# Patient Record
Sex: Female | Born: 2000 | Race: Black or African American | Hispanic: No | Smoking: Never smoker
Health system: Southern US, Community
[De-identification: ages and names within clinical notes are randomized; demographics above are authoritative.]

## PROBLEM LIST (undated history)

## (undated) DIAGNOSIS — K219 Gastro-esophageal reflux disease without esophagitis: Secondary | ICD-10-CM

## (undated) DIAGNOSIS — R519 Headache, unspecified: Secondary | ICD-10-CM

## (undated) DIAGNOSIS — J45909 Unspecified asthma, uncomplicated: Secondary | ICD-10-CM

## (undated) DIAGNOSIS — L309 Dermatitis, unspecified: Secondary | ICD-10-CM

## (undated) HISTORY — DX: Dermatitis, unspecified: L30.9

## (undated) HISTORY — DX: Headache, unspecified: R51.9

## (undated) HISTORY — PX: WISDOM TOOTH EXTRACTION: SHX21

## (undated) HISTORY — PX: TYMPANOSTOMY TUBE PLACEMENT: SHX32

---
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
## 2021-01-01 ENCOUNTER — Ambulatory Visit (INDEPENDENT_AMBULATORY_CARE_PROVIDER_SITE_OTHER): Payer: Medicaid Other | Admitting: Pediatric Endocrinology

## 2021-01-01 ENCOUNTER — Ambulatory Visit: Payer: Medicaid Other | Admitting: Gastroenterology

## 2021-01-02 ENCOUNTER — Encounter: Payer: Self-pay | Admitting: Allergy & Immunology

## 2021-01-02 ENCOUNTER — Other Ambulatory Visit: Payer: Self-pay

## 2021-01-02 ENCOUNTER — Encounter (INDEPENDENT_AMBULATORY_CARE_PROVIDER_SITE_OTHER): Payer: Self-pay | Admitting: Dietician

## 2021-01-02 ENCOUNTER — Ambulatory Visit (INDEPENDENT_AMBULATORY_CARE_PROVIDER_SITE_OTHER): Payer: Medicaid Other | Admitting: Allergy & Immunology

## 2021-01-02 VITALS — BP 120/80 | HR 90 | Temp 97.3°F | Resp 16 | Ht 60.0 in | Wt 240.0 lb

## 2021-01-02 DIAGNOSIS — J310 Chronic rhinitis: Secondary | ICD-10-CM

## 2021-01-02 DIAGNOSIS — J4530 Mild persistent asthma, uncomplicated: Secondary | ICD-10-CM

## 2021-01-02 DIAGNOSIS — R0602 Shortness of breath: Secondary | ICD-10-CM

## 2021-01-02 MED ORDER — OMEPRAZOLE 40 MG PO CPDR: 40.0000 mg | DELAYED_RELEASE_CAPSULE | Freq: Every day | ORAL | 3 refills | Status: DC

## 2021-01-02 MED ORDER — ALBUTEROL SULFATE HFA 108 (90 BASE) MCG/ACT IN AERS: 2.0000 | INHALATION_SPRAY | Freq: Four times a day (QID) | RESPIRATORY_TRACT | 3 refills | Status: DC | PRN

## 2021-01-02 MED ORDER — CETIRIZINE HCL 10 MG PO TABS: 10.0000 mg | ORAL_TABLET | Freq: Every day | ORAL | 5 refills | Status: DC

## 2021-01-02 MED ORDER — MONTELUKAST SODIUM 10 MG PO TABS: 10.0000 mg | ORAL_TABLET | Freq: Every day | ORAL | 5 refills | Status: DC

## 2021-01-02 NOTE — Progress Notes (Signed)
FOLLOW UP  Date of Service/Encounter:  01/02/21   Assessment:   Non-allergic rhinitis  Mild persistent asthma, uncomplicated  Shortness of breath - likely not asthma related since spirometry has been normal and physical exams have been normal.  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 Mild persistent asthma, uncomplicated - Add on omeprazole 40mg  in the morning to see if this can help with reflux.  - Be sure to go to your Pulmonology appointment on June 11th @ 2:30 pm. - We are adding on a daly controller medication to see if this will help (AirDuo) one puff twice daily. - Daily controller medication(s): Singulair 10mg  daily + AirDuo one puff twice daily - Prior to physical activity: ProAir 2 puffs 10-15 minutes before physical activity. - Rescue medications: ProAir 4 puffs every 4-6 hours as needed - Changes during respiratory infections or worsening symptoms: Add on Qvar June 13 to 2 puffs twice daily for ONE TO TWO WEEKS. - Asthma control goals:  * Full participation in all desired activities (may need albuterol before activity) * Albuterol use two time or less a week on average (not counting use with activity) * Cough interfering with sleep two time or less a month * Oral steroids no more than once a year * No hospitalizations  2. Non-allergic rhinitis - Continue with: Zyrtec (cetirizine) 39mL once daily, Singulair (montelukast) 10mg  daily and Flonase (fluticasone) one spray per nostril daily - You can use an extra dose of the antihistamine, if needed, for breakthrough symptoms.  - Consider nasal saline rinses 1-2 times daily to remove allergens from the nasal cavities as well as help with mucous clearance (this is especially helpful to do before the nasal sprays are given)  3. Return in about 4 weeks (around 01/30/2021).   Subjective:   Lori [name] is a 20 y.o. female presenting  today for follow up of  Chief Complaint  Patient presents with  . Asthma    Chest pain due to a pulled muscle from a few months ago. Used rescue inhaler due to chest pain. Says the albuterol helped.   . Allergic Rhinitis     No that the pollen is out she is having some issues.    04/01/2021 has a history of the following: Patient Active Problem List   Diagnosis Date Noted  . Macromastia 09/06/2020  . Chronic midline thoracic back pain 09/06/2020  . Migraine with aura and without status migrainosus, not intractable 09/06/2020  . Dysmenorrhea in adolescent 09/06/2020  . Musculoskeletal chest pain 03/03/2020  . Non-allergic rhinitis 12/30/2018  . Mild persistent asthma, uncomplicated 01/08/2018  . Perennial allergic rhinitis 01/08/2018  . Elevated hemoglobin A1c 12/18/2015  . 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 Acanthosis 12/18/2015  . Vitamin D insufficiency 12/18/2015    History obtained from: chart review and patient.  Lori [name] is a 20 y.o. female presenting for a follow up visit.  She was last seen in May 2021.  At that time, we did not make any medication changes.  We did get a chest x-ray because he was endorsing shortness of breath.  We got a D-dimer as well which was essentially normal.  We continued Singulair 10mg  daily as well as ProAir as needed.   Since the last visit, she has continued to have issues. She has not seen Pulmonology yet. This is scheduled with Dr. Lucianne Lei on June 11th @ 2:30pm.   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 She is going to take a gap year to figure out things. She tells me today that she has a back up plan.   Asthma/Respiratory Symptom History: She reports that she is still having chest pains on a daily basis. This is midline and appears to be in the same place. She has been on reflux medications with improvement in the chest pain. This has not resolved it completely, however.   Allergic Rhinitis Symptom  History: She remains on the cetirizine as well as the montelukast and the fluticasone. She has not needed any antibiotics.   Food Allergy Symptom History: She has continued to avoid peanuts. she does fine with one or two peanuts, but she starts to have symptoms with more than a handful of peanuts. . She has no problems with eating a few peanuts. She has issues with throat swelling when she eats more than a few.   Otherwise, there have been no changes to her past medical history, surgical history, family history, or social history.    Review of Systems  Constitutional: Negative.  Negative for chills, fever, malaise/fatigue and weight loss.  HENT: Negative.  Negative for congestion, ear discharge, ear pain and sore throat.   Eyes: Negative for pain, discharge and redness.  Respiratory: Positive for shortness of breath. Negative for cough, sputum production and wheezing.   Cardiovascular: Negative.  Negative for chest pain and palpitations.  Gastrointestinal: Negative for abdominal pain, constipation, diarrhea, heartburn, nausea and vomiting.  Skin: Negative.  Negative for itching and rash.  Neurological: Negative for dizziness and headaches.  Endo/Heme/Allergies: Negative for environmental allergies. Does not bruise/bleed easily.       Objective:   Blood pressure 120/80, pulse 90, temperature (!) 97.3 F (36.3 C), resp. rate 16, height 5' (1.524 m), weight 240 lb (108.9 kg), SpO2 99 %. Body mass index is 46.87 kg/m.   Physical Exam:  Physical Exam Constitutional:      Appearance: She is well-developed.  HENT:     Head: Normocephalic and atraumatic.     Right Ear: Tympanic membrane, ear canal and external ear normal.     Left Ear: Tympanic membrane, ear canal and external ear normal.     Nose: No nasal deformity, septal deviation, mucosal edema or rhinorrhea.     Right Turbinates: Enlarged, swollen and pale.     Left Turbinates: Enlarged, swollen and pale.     Right Sinus: No  maxillary sinus tenderness or frontal sinus tenderness.     Left Sinus: No maxillary sinus tenderness or frontal sinus tenderness.     Mouth/Throat:     Mouth: Mucous membranes are not pale and not dry.     Pharynx: Uvula midline.  Eyes:     General:        Right eye: No discharge.        Left eye: No discharge.     Conjunctiva/sclera: Conjunctivae normal.     Right eye: Right conjunctiva is not injected. No chemosis.    Left eye: Left conjunctiva is not injected. No chemosis.    Pupils: Pupils are equal, round, and reactive to light.  Cardiovascular:     Rate and Rhythm: Normal rate and regular rhythm.     Heart sounds: Normal heart sounds.  Pulmonary:     Effort: Pulmonary effort is normal. No tachypnea, accessory muscle usage or respiratory distress.     Breath sounds: Normal breath sounds. No wheezing, rhonchi or  rales.     Comments: Moving air well in all lung fields. No increased work of breathing noted.  Chest:     Chest wall: No tenderness.  Lymphadenopathy:     Cervical: No cervical adenopathy.  Skin:    Coloration: Skin is not pale.     Findings: No abrasion, erythema, petechiae or rash. Rash is not papular, urticarial or vesicular.  Neurological:     Mental Status: She is alert.  Psychiatric:        Behavior: Behavior is cooperative.      Diagnostic studies:    Spirometry: results normal (FEV1: 2.26/87%, FVC: 2.78/98%, FEV1/FVC: 81%).    Spirometry consistent with normal pattern.  Allergy Studies: none       Malachi Bonds, MD  [facility]

## 2021-01-02 NOTE — Patient Instructions (Addendum)
1. Mild persistent asthma, uncomplicated - Add on omeprazole 40mg  in the morning to see if this can help with reflux.  - Be sure to go to your Pulmonology appointment on June 11th @ 2:30 pm. - We are adding on a daly controller medication to see if this will help (AirDuo) one puff twice daily. - Daily controller medication(s): Singulair 10mg  daily + AirDuo one puff twice daily - Prior to physical activity: ProAir 2 puffs 10-15 minutes before physical activity. - Rescue medications: ProAir 4 puffs every 4-6 hours as needed - Changes during respiratory infections or worsening symptoms: Add on Qvar June 13 to 2 puffs twice daily for ONE TO TWO WEEKS. - Asthma control goals:  * Full participation in all desired activities (may need albuterol before activity) * Albuterol use two time or less a week on average (not counting use with activity) * Cough interfering with sleep two time or less a month * Oral steroids no more than once a year * No hospitalizations  2. Non-allergic rhinitis - Continue with: Zyrtec (cetirizine) 58mL once daily, Singulair (montelukast) 10mg  daily and Flonase (fluticasone) one spray per nostril daily - You can use an extra dose of the antihistamine, if needed, for breakthrough symptoms.  - Consider nasal saline rinses 1-2 times daily to remove allergens from the nasal cavities as well as help with mucous clearance (this is especially helpful to do before the nasal sprays are given)  3. Return in about 4 weeks (around 01/30/2021).    Please inform [facility] of any Emergency Department visits, hospitalizations, or changes in symptoms. Call before going to the ED for breathing or allergy symptoms since we might be able to fit you in for a sick visit. Feel free to contact 04/01/2021 anytime with any questions, problems, or concerns.  It was a pleasure to see you and your family again today!  Websites that have reliable patient information: 1. American Academy of Asthma, Allergy, and  Immunology: www.aaaai.org 2. Food Allergy Research and Education (FARE): foodallergy.org 3. Mothers of Asthmatics: http://www.asthmacommunitynetwork.org 4. American College of Allergy, Asthma, and Immunology: www.acaai.org   COVID-19 Vaccine Information can be found at: [place] For questions related to vaccine distribution or appointments, please email [email or web address] or call [phone].     "Like" [place] on Facebook and Instagram for our latest updates!       Make sure you are registered to vote! If you have moved or changed any of your contact information, you will need to get this updated before voting!  In some cases, you MAY be able to register to vote online: [email or web address]

## 2021-01-09 ENCOUNTER — Encounter: Payer: Self-pay | Admitting: Gastroenterology

## 2021-01-09 ENCOUNTER — Ambulatory Visit (INDEPENDENT_AMBULATORY_CARE_PROVIDER_SITE_OTHER): Payer: Medicaid Other | Admitting: Gastroenterology

## 2021-01-09 ENCOUNTER — Other Ambulatory Visit: Payer: Self-pay

## 2021-01-09 VITALS — BP 111/77 | HR 103 | Ht 60.0 in | Wt 238.4 lb

## 2021-01-09 DIAGNOSIS — R1013 Epigastric pain: Secondary | ICD-10-CM

## 2021-01-09 NOTE — Progress Notes (Signed)
Wyline Mood MD, MRCP(U.K) [address]  [address]  [place], [place] [address]  Main: [phone]  Fax: [phone]   Gastroenterology Consultation  Referring Provider:     Lamonte Richer, DO Primary Care Physician:  Lamonte Richer, DO Primary Gastroenterologist:  Dr. Wyline Mood  Reason for Consultation:     Epigastric pain        HPI:   Lori [name] is a 20 y.o. y/o female referred for consultation & management  by Dr. Renette Butters, Deon Pilling, DO.      She has been referred for epigastric pain.  She presented to the emergency room at [facility] on 10/27/2020 with chest pain and shortness of breath.  She has a history of type 2 diabetes.  The pain is felt to be from the epigastrium.  Sent home to follow-up with GI.  She is here today with her mother who is an employee at our hospital.  She states that for the past few months she has had epigastric discomfort on and off described as a sharp discomfort sometimes radiating to her chest.  Lasting for a few hours.  No clear aggravating or relieving factors.  Sometimes worse when she lays flat.  Also worse when she bends forward.  No change in weight.  Denies any difficulty swallowing.  Was prescribed omeprazole but is yet to start taking it.  Does have a sensation of early satiety at times.  Has used marijuana in the past.  Possibly also from passive smoking as well.  Past Medical History:  Diagnosis Date  . Asthma   . Eczema   . Headache     Past Surgical History:  Procedure Laterality Date  . TYMPANOSTOMY TUBE PLACEMENT    . WISDOM TOOTH EXTRACTION      Prior to Admission medications   Medication Sig Start Date End Date Taking? Authorizing Provider  albuterol (VENTOLIN HFA) 108 (90 Base) MCG/ACT inhaler Inhale 2 puffs into the lungs every 6 (six) hours as needed for wheezing or shortness of breath. 01/02/21  Yes Alfonse Spruce, MD  butalbital-acetaminophen-caffeine [facility]) [phone] MG tablet Take 1 tablet by mouth  every 6 (six) hours as needed for headache. 10/18/20 10/18/21 Yes Joni Reining, PA-C  cetirizine (ZYRTEC) 10 MG tablet Take 1 tablet (10 mg total) by mouth daily. 01/02/21 02/01/21 Yes Alfonse Spruce, MD  clobetasol ointment (TEMOVATE) 0.05 % Apply 1 application topically 2 (two) times daily. 10/05/20  Yes Alfonse Spruce, MD  Drospirenone (SLYND) 4 MG TABS Take 1 tablet by mouth daily. 09/05/20  Yes Leftwich-Kirby, Wilmer Floor, CNM  levocetirizine (XYZAL) 5 MG tablet Take 1 tablet (5 mg total) by mouth every evening. 10/28/20  Yes Roxy Horseman, PA-C  montelukast (SINGULAIR) 10 MG tablet Take 1 tablet (10 mg total) by mouth at bedtime. 01/02/21  Yes Alfonse Spruce, MD  omeprazole (PRILOSEC) 40 MG capsule Take 1 capsule (40 mg total) by mouth daily. 01/02/21 02/01/21 Yes Alfonse Spruce, MD  rizatriptan (MAXALT-MLT) 10 MG disintegrating tablet Take 1 tablet (10 mg total) by mouth as needed for migraine (take 1 tab at severe headache onset, you may repeat second dose after 2 hours if needed but not more than 2 tablets a day). May repeat in 2 hours if needed 10/20/20  Yes Abdelmoumen, Imane, MD  topiramate (TOPAMAX) 25 MG tablet Take 2 tablets (50 mg total) by mouth at bedtime. Start with 25 mg tablet at bedtime for 3 days then continue on  50 mg daily at bedtime. 10/20/20  Yes Abdelmoumen, Jenna Luo, MD  acetaminophen (TYLENOL) 325 MG tablet Take 2 tablets (650 mg total) by mouth every 6 (six) hours as needed for mild pain, fever or headache. 10/04/16   Scoville, Nadara Mustard, NP  methylPREDNISolone (MEDROL DOSEPAK) 4 MG TBPK tablet Take by mouth. Patient not taking: Reported on 01/09/2021 10/25/20   [provider]  ondansetron (ZOFRAN ODT) 4 MG disintegrating tablet Take 1 tablet (4 mg total) by mouth every 8 (eight) hours as needed for nausea or vomiting. 10/28/20   Roxy Horseman, PA-C  sucralfate (CARAFATE) 1 g tablet Take 1 tablet (1 g total) by mouth 4 (four) times daily -  with meals  and at bedtime. Patient not taking: Reported on 01/09/2021 10/28/20   Roxy Horseman, PA-C  SUMAtriptan (IMITREX) 25 MG tablet Take by mouth. 07/27/20 10/20/20  [provider]    Family History  Problem Relation Age of Onset  . Hypertension Mother   . Hyperlipidemia Father   . Diabetes type II Father   . Depression Father   . Plantar fasciitis Father   . Graves' disease Maternal Grandmother   . Diabetes type II Maternal Grandmother   . Hypertension Maternal Grandmother   . Kidney Stones Maternal Grandmother   . Stroke Maternal Grandfather   . Hypertension Maternal Grandfather   . Hyperlipidemia Maternal Grandfather   . Heart attack Maternal Grandfather   . Diabetes type II Maternal Grandfather   . Diabetes Paternal Grandmother   . Diabetes type II Paternal Grandmother   . Hypertension Paternal Grandmother   . Kidney failure Paternal Grandfather   . Diabetic kidney disease Paternal Grandfather   . Hypertension Paternal Grandfather   . Diabetes type II Paternal Grandfather   . Asthma Sister   . Diabetes type I Maternal Great-grandmother   . Obesity Sister 48  . Vitamin D deficiency Sister 13  .  (Acanthosis) Sister 64  .  (Elevated hemoglobin A1c) Sister 82     Social History   Tobacco Use  . Smoking status: Passive Smoke Exposure - Never Smoker  . Smokeless tobacco: Never Used  . Tobacco comment: father smokes inside the home  Vaping Use  . Vaping Use: Never used  Substance Use Topics  . Alcohol use: No  . Drug use: Yes    Types: Marijuana    Comment: once on 19th B-Day    Allergies as of 01/09/2021 - Review Complete 01/09/2021  Allergen Reaction Noted  . Peanut-containing drug products  10/02/2020    Review of Systems:    All systems reviewed and negative except where noted in HPI.   Physical Exam:  BP 111/77 (BP Location: Right Arm, Patient Position: Sitting, Cuff Size: Large)   Pulse (!) 103   Ht 5' (1.524 m)   Wt 238 lb 6.4 oz (108.1 kg)   BMI  46.56 kg/m  No LMP recorded. Psych:  Alert and cooperative. Normal mood and affect. General:   Alert,  Well-developed, well-nourished, pleasant and cooperative in NAD Head:  Normocephalic and atraumatic. Eyes:  Sclera clear, no icterus.   Conjunctiva pink. Ears:  Normal auditory acuity. Lungs:  Respirations even and unlabored.  Clear throughout to auscultation.   No wheezes, crackles, or rhonchi. No acute distress. Heart:  Regular rate and rhythm; no murmurs, clicks, rubs, or gallops. Abdomen:  Normal bowel sounds.  No bruits.  Soft, non-tender and non-distended without masses, hepatosplenomegaly or hernias noted.  No guarding or rebound tenderness.  Neurologic:  Alert and oriented x3;  grossly normal neurologically. Psych:  Alert and cooperative. Normal mood and affect.  Imaging Studies: No results found.  Assessment and Plan:   Lori [name] is a 20 y.o. y/o female has been referred for epigastric discomfort.  Her symptoms very likely are suggestive of acid reflux especially the fact that it occurs when it is worse when she bends forward or lays flat.  He does have a history of heartburn, chest discomfort.  She has not undertaken a trial of PPI also prescribed.  We discussed about starting on a PPI for about 6 weeks to see if it helps.  I talked about lifestyle changes for acid reflux including using a wedge pillow, avoid eating for 2 hours  before bedtime, having small meals more often rather than large meals at one time.  I also discussed about avoiding exposure to marijuana, she does have relief after taking a hot shower which could be related to the marijuana use.  It can also delay gastric emptying and worsen acid reflux.  I also explained to her that weight loss would significantly help her with acid reflux.  If these conservative measures do not work in 6 to 8 weeks we will consider endoscopy at that point of time.  I will also check her for H. pylori   Follow up in 6 to 8 weeks  video visit  Dr Wyline Mood MD,MRCP(U.K)

## 2021-01-09 NOTE — Patient Instructions (Signed)
Gastroesophageal Reflux Disease, Adult  Gastroesophageal reflux (GER) happens when acid from the stomach flows up into the tube that connects the mouth and the stomach (esophagus). Normally, food travels down the esophagus and stays in the stomach to be digested. With GER, food and stomach acid sometimes move back up into the esophagus. You may have a disease called gastroesophageal reflux disease (GERD) if the reflux:  Happens often.  Causes frequent or very bad symptoms.  Causes problems such as damage to the esophagus. When this happens, the esophagus becomes sore and swollen. Over time, GERD can make small holes (ulcers) in the lining of the esophagus. What are the causes? This condition is caused by a problem with the muscle between the esophagus and the stomach. When this muscle is weak or not normal, it does not close properly to keep food and acid from coming back up from the stomach. The muscle can be weak because of:  Tobacco use.  Pregnancy.  Having a certain type of hernia (hiatal hernia).  Alcohol use.  Certain foods and drinks, such as coffee, chocolate, onions, and peppermint. What increases the risk?  Being overweight.  Having a disease that affects your connective tissue.  Taking NSAIDs, such a ibuprofen. What are the signs or symptoms?  Heartburn.  Difficult or painful swallowing.  The feeling of having a lump in the throat.  A bitter taste in the mouth.  Bad breath.  Having a lot of saliva.  Having an upset or bloated stomach.  Burping.  Chest pain. Different conditions can cause chest pain. Make sure you see your doctor if you have chest pain.  Shortness of breath or wheezing.  A long-term cough or a cough at night.  Wearing away of the surface of teeth (tooth enamel).  Weight loss. How is this treated?  Making changes to your diet.  Taking medicine.  Having surgery. Treatment will depend on how bad your symptoms are. Follow these  instructions at home: Eating and drinking  Follow a diet as told by your doctor. You may need to avoid foods and drinks such as: ? Coffee and tea, with or without caffeine. ? Drinks that contain alcohol. ? Energy drinks and sports drinks. ? Bubbly (carbonated) drinks or sodas. ? Chocolate and cocoa. ? Peppermint and mint flavorings. ? Garlic and onions. ? Horseradish. ? Spicy and acidic foods. These include peppers, chili powder, curry powder, vinegar, hot sauces, and BBQ sauce. ? Citrus fruit juices and citrus fruits, such as oranges, lemons, and limes. ? Tomato-based foods. These include red sauce, chili, salsa, and pizza with red sauce. ? Fried and fatty foods. These include donuts, french fries, potato chips, and high-fat dressings. ? High-fat meats. These include hot dogs, rib eye steak, sausage, ham, and bacon. ? High-fat dairy items, such as whole milk, butter, and cream cheese.  Eat small meals often. Avoid eating large meals.  Avoid drinking large amounts of liquid with your meals.  Avoid eating meals during the 2-3 hours before bedtime.  Avoid lying down right after you eat.  Do not exercise right after you eat.   Lifestyle  Do not smoke or use any products that contain nicotine or tobacco. If you need help quitting, ask your doctor.  Try to lower your stress. If you need help doing this, ask your doctor.  If you are overweight, lose an amount of weight that is healthy for you. Ask your doctor about a safe weight loss goal.   General instructions    Pay attention to any changes in your symptoms.  Take over-the-counter and prescription medicines only as told by your doctor.  Do not take aspirin, ibuprofen, or other NSAIDs unless your doctor says it is okay.  Wear loose clothes. Do not wear anything tight around your waist.  Raise (elevate) the head of your bed about 6 inches (15 cm). You may need to use a wedge to do this.  Avoid bending over if this makes your  symptoms worse.  Keep all follow-up visits. Contact a doctor if:  You have new symptoms.  You lose weight and you do not know why.  You have trouble swallowing or it hurts to swallow.  You have wheezing or a cough that keeps happening.  You have a hoarse voice.  Your symptoms do not get better with treatment. Get help right away if:  You have sudden pain in your arms, neck, jaw, teeth, or back.  You suddenly feel sweaty, dizzy, or light-headed.  You have chest pain or shortness of breath.  You vomit and the vomit is green, yellow, or black, or it looks like blood or coffee grounds.  You faint.  Your poop (stool) is red, bloody, or black.  You cannot swallow, drink, or eat. These symptoms may represent a serious problem that is an emergency. Do not wait to see if the symptoms will go away. Get medical help right away. Call your local emergency services (911 in the [place] Do not drive yourself to the hospital. Summary  If a person has gastroesophageal reflux disease (GERD), food and stomach acid move back up into the esophagus and cause symptoms or problems such as damage to the esophagus.  Treatment will depend on how bad your symptoms are.  Follow a diet as told by your doctor.  Take all medicines only as told by your doctor. This information is not intended to replace advice given to you by your health care provider. Make sure you discuss any questions you have with your health care provider. Document Revised: 03/20/2020 Document Reviewed: 03/20/2020 Elsevier Patient Education  2021 Elsevier Inc.  

## 2021-01-10 LAB — H. PYLORI BREATH TEST: H pylori Breath Test: NEGATIVE

## 2021-01-11 ENCOUNTER — Encounter: Payer: Self-pay | Admitting: Gastroenterology

## 2021-01-18 ENCOUNTER — Ambulatory Visit (INDEPENDENT_AMBULATORY_CARE_PROVIDER_SITE_OTHER): Payer: Medicaid Other | Admitting: Pediatrics

## 2021-01-30 ENCOUNTER — Ambulatory Visit: Payer: Medicaid Other | Admitting: Allergy & Immunology

## 2021-03-05 ENCOUNTER — Telehealth (INDEPENDENT_AMBULATORY_CARE_PROVIDER_SITE_OTHER): Payer: Medicaid Other | Admitting: Gastroenterology

## 2021-03-05 ENCOUNTER — Other Ambulatory Visit: Payer: Self-pay

## 2021-03-05 ENCOUNTER — Encounter: Payer: Self-pay | Admitting: Gastroenterology

## 2021-03-05 DIAGNOSIS — Z87898 Personal history of other specified conditions: Secondary | ICD-10-CM | POA: Diagnosis not present

## 2021-03-05 DIAGNOSIS — K219 Gastro-esophageal reflux disease without esophagitis: Secondary | ICD-10-CM | POA: Diagnosis not present

## 2021-03-05 DIAGNOSIS — F1291 Cannabis use, unspecified, in remission: Secondary | ICD-10-CM

## 2021-03-05 NOTE — Progress Notes (Signed)
Wyline Mood MD, MRCP(U.K) [address]  [address]  [place], [place] [address]  Main: [phone]  Fax: [phone]   Primary Care Physician: Lamonte Richer, DO  Primary Gastroenterologist:  Dr. Wyline Mood   Chief Complaint  Patient presents with   Dyspepsia    HPI: Lori [name] is a 20 y.o. female  Summary of history :  Initially seen and referred on 01/09/2021 for epigastric pain.  She presented to the emergency room at [facility] on 10/27/2020 with chest pain and shortness of breath.  She has a history of type 2 diabetes.  The pain was felt to be from the epigastrium.  She stated that for the past few months she has had epigastric discomfort on and off described as a sharp discomfort sometimes radiating to her chest.  Lasting for a few hours.  No clear aggravating or relieving factors.  Sometimes worse when she lays flat.  Also worse when she bends forward.  No change in weight.  Denies any difficulty swallowing.  Was prescribed omeprazole but is yet to start taking it.  Does have a sensation of early satiety at times.  Has used marijuana in the past.  Possibly also from passive smoking as well.   Interval history  01/09/2021-03/05/2021  After her last visit she stopped using marijuana and all her symptoms resolved.  At the same time she has been taking omeprazole 40 mg once a day.  Subsequently she restarted smoking marijuana and has started developing nausea.  Symptoms significantly improve when she takes a hot shower.    Current Outpatient Medications  Medication Sig Dispense Refill   acetaminophen (TYLENOL) 325 MG tablet Take 2 tablets (650 mg total) by mouth every 6 (six) hours as needed for mild pain, fever or headache. 30 tablet 0   albuterol (VENTOLIN HFA) 108 (90 Base) MCG/ACT inhaler Inhale 2 puffs into the lungs every 6 (six) hours as needed for wheezing or shortness of breath. 18 g 3   butalbital-acetaminophen-caffeine (FIORICET) 50-325-40 MG tablet Take 1  tablet by mouth every 6 (six) hours as needed for headache. 20 tablet 0   clobetasol ointment (TEMOVATE) 0.05 % Apply 1 application topically 2 (two) times daily. 30 g 0   Drospirenone (SLYND) 4 MG TABS Take 1 tablet by mouth daily. 28 tablet 11   levocetirizine (XYZAL) 5 MG tablet Take 1 tablet (5 mg total) by mouth every evening. 30 tablet 0   methylPREDNISolone (MEDROL DOSEPAK) 4 MG TBPK tablet Take by mouth.     montelukast (SINGULAIR) 10 MG tablet Take 1 tablet (10 mg total) by mouth at bedtime. 30 tablet 5   ondansetron (ZOFRAN ODT) 4 MG disintegrating tablet Take 1 tablet (4 mg total) by mouth every 8 (eight) hours as needed for nausea or vomiting. 10 tablet 0   rizatriptan (MAXALT-MLT) 10 MG disintegrating tablet Take 1 tablet (10 mg total) by mouth as needed for migraine (take 1 tab at severe headache onset, you may repeat second dose after 2 hours if needed but not more than 2 tablets a day). May repeat in 2 hours if needed 10 tablet 0   sucralfate (CARAFATE) 1 g tablet Take 1 tablet (1 g total) by mouth 4 (four) times daily -  with meals and at bedtime. 120 tablet 0   topiramate (TOPAMAX) 25 MG tablet Take 2 tablets (50 mg total) by mouth at bedtime. Start with 25 mg tablet at bedtime for 3 days then continue on 50 mg  daily at bedtime. 60 tablet 3   No current facility-administered medications for this visit.    Allergies as of 03/05/2021 - Review Complete 03/05/2021  Allergen Reaction Noted   Other Anaphylaxis 08/23/2020   Peanut-containing drug products  10/02/2020    ROS:  General: Negative for anorexia, weight loss, fever, chills, fatigue, weakness. ENT: Negative for hoarseness, difficulty swallowing , nasal congestion. CV: Negative for chest pain, angina, palpitations, dyspnea on exertion, peripheral edema.  Respiratory: Negative for dyspnea at rest, dyspnea on exertion, cough, sputum, wheezing.  GI: See history of present illness. GU:  Negative for dysuria, hematuria,  urinary incontinence, urinary frequency, nocturnal urination.  Endo: Negative for unusual weight change.    Physical Examination:   There were no vitals taken for this visit.  General: Well-nourished, well-developed in no acute distress.  Eyes: No icterus. Conjunctivae pink. Mouth: Oropharyngeal mucosa moist and pink , no lesions erythema or exudate. Lungs: Clear to auscultation bilaterally. Non-labored. Heart: Regular rate and rhythm, no murmurs rubs or gallops.  Abdomen: Bowel sounds are normal, nontender, nondistended, no hepatosplenomegaly or masses, no abdominal bruits or hernia , no rebound or guarding.   Extremities: No lower extremity edema. No clubbing or deformities. Neuro: Alert and oriented x 3.  Grossly intact. Skin: Warm and dry, no jaundice.   Psych: Alert and cooperative, normal mood and affect.   Imaging Studies: No results found.  Assessment and Plan:   Lori [name] is a 20 y.o. 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  She has clearly shown improvement of her symptoms when going off nausea and taking her Prilosec.  I did discuss that treating the root cause in terms of stopping marijuana would significantly help her.  She is going to think about it.  We also discussed about conservative management of acid reflux with aim towards reducing her weight which will help her significantly.  In the meanwhile continue omeprazole but decrease to 20 mg once a day.  It is unlikely she would be able to go off of it until she loses weight and stopped the marijuana.  Marijuana is associated with gastroparesis.  This in turn can make the acid reflux worse.   Dr Wyline Mood  MD,MRCP [facility]) Follow up in 6 months to discuss cessation of omeprazole if possible

## 2021-09-01 ENCOUNTER — Encounter: Payer: Self-pay | Admitting: Emergency Medicine

## 2021-09-01 ENCOUNTER — Ambulatory Visit
Admission: EM | Admit: 2021-09-01 | Discharge: 2021-09-01 | Disposition: A | Discharge status: Home / Self Care | Payer: Medicaid Other | Attending: Urgent Care | Admitting: Urgent Care

## 2021-09-01 ENCOUNTER — Other Ambulatory Visit: Payer: Self-pay

## 2021-09-01 DIAGNOSIS — B349 Viral infection, unspecified: Secondary | ICD-10-CM | POA: Diagnosis not present

## 2021-09-01 DIAGNOSIS — J3089 Other allergic rhinitis: Secondary | ICD-10-CM

## 2021-09-01 DIAGNOSIS — J4530 Mild persistent asthma, uncomplicated: Secondary | ICD-10-CM

## 2021-09-01 DIAGNOSIS — Z20828 Contact with and (suspected) exposure to other viral communicable diseases: Secondary | ICD-10-CM

## 2021-09-01 MED ORDER — CETIRIZINE HCL 10 MG PO TABS: 10.0000 mg | ORAL_TABLET | Freq: Every day | ORAL | 0 refills | Status: DC

## 2021-09-01 MED ORDER — PROMETHAZINE-DM 6.25-15 MG/5ML PO SYRP: 5.0000 mL | ORAL_SOLUTION | Freq: Every evening | ORAL | 0 refills | Status: DC | PRN

## 2021-09-01 MED ORDER — BENZONATATE 100 MG PO CAPS: 100.0000 mg | ORAL_CAPSULE | Freq: Three times a day (TID) | ORAL | 0 refills | Status: DC | PRN

## 2021-09-01 MED ORDER — PSEUDOEPHEDRINE HCL 60 MG PO TABS: 60.0000 mg | ORAL_TABLET | Freq: Three times a day (TID) | ORAL | 0 refills | Status: AC | PRN

## 2021-09-01 NOTE — ED Provider Notes (Signed)
[facility]   MRN: [id number] DOB: 09-20-2001  Subjective:   Lori [name] is a 20 y.o. female presenting for 1 day history of acute onset nausea, persistent coughing with posttussive emesis, headaches, belly pain.  Patient has a history of mild persistent asthma, has not needed her inhaler.  Also has a history of allergic rhinitis.  No chest pain, shortness of breath or wheezing, body aches, fevers.  Would like to be checked for COVID and flu.  She is not a smoker.  No sick contacts to her knowledge.  No current facility-administered medications for this encounter.  Current Outpatient Medications:    acetaminophen (TYLENOL) 325 MG tablet, Take 2 tablets (650 mg total) by mouth every 6 (six) hours as needed for mild pain, fever or headache., Disp: 30 tablet, Rfl: 0   albuterol (VENTOLIN HFA) 108 (90 Base) MCG/ACT inhaler, Inhale 2 puffs into the lungs every 6 (six) hours as needed for wheezing or shortness of breath., Disp: 18 g, Rfl: 3   butalbital-acetaminophen-caffeine (FIORICET) 50-325-40 MG tablet, Take 1 tablet by mouth every 6 (six) hours as needed for headache., Disp: 20 tablet, Rfl: 0   clobetasol ointment (TEMOVATE) 0.05 %, Apply 1 application topically 2 (two) times daily., Disp: 30 g, Rfl: 0   Drospirenone (SLYND) 4 MG TABS, Take 1 tablet by mouth daily., Disp: 28 tablet, Rfl: 11   levocetirizine (XYZAL) 5 MG tablet, Take 1 tablet (5 mg total) by mouth every evening., Disp: 30 tablet, Rfl: 0   methylPREDNISolone (MEDROL DOSEPAK) 4 MG TBPK tablet, Take by mouth., Disp: , Rfl:    montelukast (SINGULAIR) 10 MG tablet, Take 1 tablet (10 mg total) by mouth at bedtime., Disp: 30 tablet, Rfl: 5   ondansetron (ZOFRAN ODT) 4 MG disintegrating tablet, Take 1 tablet (4 mg total) by mouth every 8 (eight) hours as needed for nausea or vomiting., Disp: 10 tablet, Rfl: 0   rizatriptan (MAXALT-MLT) 10 MG disintegrating tablet, Take 1 tablet (10 mg total) by mouth as needed for  migraine (take 1 tab at severe headache onset, you may repeat second dose after 2 hours if needed but not more than 2 tablets a day). May repeat in 2 hours if needed, Disp: 10 tablet, Rfl: 0   sucralfate (CARAFATE) 1 g tablet, Take 1 tablet (1 g total) by mouth 4 (four) times daily -  with meals and at bedtime., Disp: 120 tablet, Rfl: 0   topiramate (TOPAMAX) 25 MG tablet, Take 2 tablets (50 mg total) by mouth at bedtime. Start with 25 mg tablet at bedtime for 3 days then continue on 50 mg daily at bedtime., Disp: 60 tablet, Rfl: 3   Allergies  Allergen Reactions   Other Anaphylaxis    Not definitive; but throat closes up when she ingested  "feels like throat is closing" going for testing 10/05/2020   Peanut-Containing Drug Products     "feels like throat is closing" going for testing 10/05/2020    Past Medical History:  Diagnosis Date   Asthma    Eczema    Headache      Past Surgical History:  Procedure Laterality Date   TYMPANOSTOMY TUBE PLACEMENT     WISDOM TOOTH EXTRACTION      Family History  Problem Relation Age of Onset   Hypertension Mother    Hyperlipidemia Father    Diabetes type II Father    Depression Father    Plantar fasciitis Father    Luiz Blare' disease Maternal Grandmother  Diabetes type II Maternal Grandmother    Hypertension Maternal Grandmother    Kidney Stones Maternal Grandmother    Stroke Maternal Grandfather    Hypertension Maternal Grandfather    Hyperlipidemia Maternal Grandfather    Heart attack Maternal Grandfather    Diabetes type II Maternal Grandfather    Diabetes Paternal Grandmother    Diabetes type II Paternal Grandmother    Hypertension Paternal Grandmother    Kidney failure Paternal Grandfather    Diabetic kidney disease Paternal Grandfather    Hypertension Paternal Grandfather    Diabetes type II Paternal Grandfather    Asthma Sister    Diabetes type I Maternal Great-grandmother    Obesity Sister 106   Vitamin D deficiency Sister  40    (Acanthosis) Sister 84    (Elevated hemoglobin A1c) Sister 31    Social History   Tobacco Use   Smoking status: Passive Smoke Exposure - Never Smoker   Smokeless tobacco: Never   Tobacco comments:    father smokes inside the home  Vaping Use   Vaping Use: Never used  Substance Use Topics   Alcohol use: No   Drug use: Yes    Types: Marijuana    Comment: once on 18th B-Day    ROS   Objective:   Vitals: BP 111/78 (BP Location: Right Arm)   Pulse 100   Temp 98.9 F (37.2 C) (Oral)   Resp 18   LMP 08/13/2021 (Approximate)   SpO2 98%   Physical Exam Constitutional:      General: She is not in acute distress.    Appearance: Normal appearance. She is well-developed. She is not ill-appearing, toxic-appearing or diaphoretic.  HENT:     Head: Normocephalic and atraumatic.     Right Ear: Tympanic membrane, ear canal and external ear normal. No drainage or tenderness. No middle ear effusion. Tympanic membrane is not erythematous.     Left Ear: Tympanic membrane, ear canal and external ear normal. No drainage or tenderness.  No middle ear effusion. Tympanic membrane is not erythematous.     Nose: Nose normal. No congestion or rhinorrhea.     Mouth/Throat:     Mouth: Mucous membranes are moist. No oral lesions.     Pharynx: Oropharynx is clear. No pharyngeal swelling, oropharyngeal exudate, posterior oropharyngeal erythema or uvula swelling.     Tonsils: No tonsillar exudate or tonsillar abscesses.  Eyes:     Extraocular Movements: Extraocular movements intact.     Right eye: Normal extraocular motion.     Left eye: Normal extraocular motion.     Conjunctiva/sclera: Conjunctivae normal.     Pupils: Pupils are equal, round, and reactive to light.  Cardiovascular:     Rate and Rhythm: Normal rate and regular rhythm.     Pulses: Normal pulses.     Heart sounds: Normal heart sounds. No murmur heard.   No friction rub. No gallop.  Pulmonary:     Effort: Pulmonary effort  is normal. No respiratory distress.     Breath sounds: Normal breath sounds. No stridor. No wheezing, rhonchi or rales.  Musculoskeletal:     Cervical back: Normal range of motion and neck supple.  Lymphadenopathy:     Cervical: No cervical adenopathy.  Skin:    General: Skin is warm and dry.     Findings: No rash.  Neurological:     General: No focal deficit present.     Mental Status: She is alert and oriented to person, place, and time.  Psychiatric:        Mood and Affect: Mood normal.        Behavior: Behavior normal.        Thought Content: Thought content normal.    Assessment and Plan :   PDMP not reviewed this encounter.  1. Acute viral syndrome   2. Exposure to the flu   3. Mild persistent asthma without complication   4. Allergic rhinitis due to other allergic trigger, unspecified seasonality    Deferred imaging given clear cardiopulmonary exam, hemodynamically stable vital signs. COVID and flu test pending.  We will otherwise manage for viral upper respiratory infection.  Physical exam findings reassuring and vital signs stable for discharge. Advised supportive care, offered symptomatic relief. Counseled patient on potential for adverse effects with medications prescribed/recommended today, ER and return-to-clinic precautions discussed, patient verbalized understanding.      Wallis Bamberg, [place] 09/01/21 [id number]

## 2021-09-01 NOTE — ED Triage Notes (Signed)
Headache, stomach pain, nausea and vomiting that started today.  Chills.

## 2021-09-01 NOTE — Discharge Instructions (Signed)
We will notify you of your test results as they arrive and may take between 48-72 hours.  I encourage you to sign up for MyChart if you have not already done so as this can be the easiest way for us to communicate results to you online or through a phone app.  Generally, we only contact you if it is a positive test result.  In the meantime, if you develop worsening symptoms including fever, chest pain, shortness of breath despite our current treatment plan then please report to the emergency room as this may be a sign of worsening status from possible viral infection. ° °Otherwise, we will manage this as a viral syndrome. For sore throat or cough try using a honey-based tea. Use 3 teaspoons of honey with juice squeezed from half lemon. Place shaved pieces of ginger into 1/2-1 cup of water and warm over stove top. Then mix the ingredients and repeat every 4 hours as needed. Please take Tylenol 500mg-650mg every 6 hours for aches and pains, fevers. Hydrate very well with at least 2 liters of water. Eat light meals such as soups to replenish electrolytes and soft fruits, veggies. Start an antihistamine like Zyrtec for postnasal drainage, sinus congestion.  You can take this together with pseudoephedrine (Sudafed) at a dose of 60 mg 2-3 times a day as needed for the same kind of congestion.  Use the cough medications as needed.  °

## 2021-09-02 LAB — COVID-19, FLU A+B NAA
Influenza A, NAA: NOT DETECTED
Influenza B, NAA: NOT DETECTED
SARS-CoV-2, NAA: NOT DETECTED

## 2021-09-04 ENCOUNTER — Ambulatory Visit: Payer: Medicaid Other | Admitting: Gastroenterology

## 2021-10-22 ENCOUNTER — Ambulatory Visit (INDEPENDENT_AMBULATORY_CARE_PROVIDER_SITE_OTHER): Payer: Medicaid Other | Admitting: Gastroenterology

## 2021-10-22 ENCOUNTER — Encounter: Payer: Self-pay | Admitting: Gastroenterology

## 2021-10-22 ENCOUNTER — Other Ambulatory Visit: Payer: Self-pay

## 2021-10-22 VITALS — BP 108/72 | HR 92 | Temp 98.6°F | Ht 60.0 in | Wt 240.4 lb

## 2021-10-22 DIAGNOSIS — F1291 Cannabis use, unspecified, in remission: Secondary | ICD-10-CM | POA: Diagnosis not present

## 2021-10-22 DIAGNOSIS — K219 Gastro-esophageal reflux disease without esophagitis: Secondary | ICD-10-CM

## 2021-10-22 NOTE — Progress Notes (Signed)
Jonathon Bellows MD, MRCP(U.K) [address]  [place]  [place], [place] [address]  Main: [phone]  Fax: [phone]   Primary Care Physician: Guadelupe Sabin, DO  Primary Gastroenterologist:  Dr. Jonathon Bellows   Chief Complaint  Patient presents with   Dyspepsia    HPI: Lori [name] is a 21 y.o. female  Summary of history :   Initially seen and referred on 01/09/2021 for epigastric pain.  Her symptoms in the past have been attributed to use of marijuana when she developed nausea.  Good improvement in symptoms when she went off marijuana and took Prilosec.  Prior history also suggestive of acid reflux.   Interval history 03/05/2021-10/22/2021   Since last visit very occasional symptoms of dyspepsia.  Takes Prilosec as needed.  smoking marijuana occasionally.  But no other GI symptoms at this point of time.  She is doing well   Current Outpatient Medications  Medication Sig Dispense Refill   acetaminophen (TYLENOL) 325 MG tablet Take 2 tablets (650 mg total) by mouth every 6 (six) hours as needed for mild pain, fever or headache. 30 tablet 0   albuterol (VENTOLIN HFA) 108 (90 Base) MCG/ACT inhaler Inhale 2 puffs into the lungs every 6 (six) hours as needed for wheezing or shortness of breath. 18 g 3   benzonatate (TESSALON) 100 MG capsule Take 1-2 capsules (100-200 mg total) by mouth 3 (three) times daily as needed for cough. 60 capsule 0   cetirizine (ZYRTEC ALLERGY) 10 MG tablet Take 1 tablet (10 mg total) by mouth daily. 30 tablet 0   clobetasol ointment (TEMOVATE) [id number] % Apply 1 application topically 2 (two) times daily. 30 g 0   Drospirenone (SLYND) 4 MG TABS Take 1 tablet by mouth daily. 28 tablet 11   levocetirizine (XYZAL) 5 MG tablet Take 1 tablet (5 mg total) by mouth every evening. 30 tablet 0   methylPREDNISolone (MEDROL DOSEPAK) 4 MG TBPK tablet Take by mouth.     montelukast (SINGULAIR) 10 MG tablet Take 1 tablet (10 mg total) by mouth at bedtime. 30 tablet 5    ondansetron (ZOFRAN ODT) 4 MG disintegrating tablet Take 1 tablet (4 mg total) by mouth every 8 (eight) hours as needed for nausea or vomiting. 10 tablet 0   promethazine-dextromethorphan (PROMETHAZINE-DM) 6.25-15 MG/5ML syrup Take 5 mLs by mouth at bedtime as needed for cough. 100 mL 0   pseudoephedrine (SUDAFED) 60 MG tablet Take 1 tablet (60 mg total) by mouth every 8 (eight) hours as needed for congestion. 30 tablet 0   rizatriptan (MAXALT-MLT) 10 MG disintegrating tablet Take 1 tablet (10 mg total) by mouth as needed for migraine (take 1 tab at severe headache onset, you may repeat second dose after 2 hours if needed but not more than 2 tablets a day). May repeat in 2 hours if needed 10 tablet 0   sucralfate (CARAFATE) 1 g tablet Take 1 tablet (1 g total) by mouth 4 (four) times daily -  with meals and at bedtime. 120 tablet 0   topiramate (TOPAMAX) 25 MG tablet Take 2 tablets (50 mg total) by mouth at bedtime. Start with 25 mg tablet at bedtime for 3 days then continue on 50 mg daily at bedtime. 60 tablet 3   No current facility-administered medications for this visit.    Allergies as of 10/22/2021 - Review Complete 09/01/2021  Allergen Reaction Noted   Other Anaphylaxis 08/23/2020   Peanut-containing drug products  10/02/2020    ROS:  General: Negative for anorexia, weight loss, fever, chills, fatigue, weakness. ENT: Negative for hoarseness, difficulty swallowing , nasal congestion. CV: Negative for chest pain, angina, palpitations, dyspnea on exertion, peripheral edema.  Respiratory: Negative for dyspnea at rest, dyspnea on exertion, cough, sputum, wheezing.  GI: See history of present illness. GU:  Negative for dysuria, hematuria, urinary incontinence, urinary frequency, nocturnal urination.  Endo: Negative for unusual weight change.    Physical Examination:   BP 108/72    Pulse 92    Temp 98.6 F (37 C) (Oral)    Ht 5' (1.524 m)    Wt 240 lb 6.4 oz (109 kg)    BMI 46.95  kg/m   General: Well-nourished, well-developed in no acute distress.  Eyes: No icterus. Conjunctivae pink. Neuro: Alert and oriented x 3.  Grossly intact. Skin: Warm and dry, no jaundice.   Psych: Alert and cooperative, normal mood and affect.   Imaging Studies: No results found.  Assessment and Plan:   Lori [name] is a 21 y.o. y/o female here to follow-up for acid reflux and prior history of marijuana associated nausea.  All her symptoms have significantly resolved.  She is taking Prilosec as needed.  Plan 1.  Stop Prilosec due to long-term side effects associated and take Pepcid as needed 2.  Stop marijuana use which I reiterated 3.  Continue lifestyle changes for acid reflux including weight loss, avoid eating for 2 to 3 hours before bedtime using a wedge pillow at night and having small meals more often rather than large meals at 1 time        Dr Jonathon Bellows  MD,MRCP [facility]) Follow up in follow-up as needed

## 2021-11-06 ENCOUNTER — Other Ambulatory Visit (HOSPITAL_COMMUNITY)
Admission: RE | Admit: 2021-11-06 | Discharge: 2021-11-06 | Disposition: A | Discharge status: Home / Self Care | Payer: Medicaid Other | Source: Ambulatory Visit | Attending: Advanced Practice Midwife | Admitting: Advanced Practice Midwife

## 2021-11-06 ENCOUNTER — Other Ambulatory Visit: Payer: Self-pay

## 2021-11-06 ENCOUNTER — Encounter: Payer: Self-pay | Admitting: Advanced Practice Midwife

## 2021-11-06 ENCOUNTER — Ambulatory Visit (INDEPENDENT_AMBULATORY_CARE_PROVIDER_SITE_OTHER): Payer: Medicaid Other | Admitting: Advanced Practice Midwife

## 2021-11-06 VITALS — BP 119/76 | HR 92 | Ht 60.0 in | Wt 238.0 lb

## 2021-11-06 DIAGNOSIS — N926 Irregular menstruation, unspecified: Secondary | ICD-10-CM | POA: Insufficient documentation

## 2021-11-06 DIAGNOSIS — Z3202 Encounter for pregnancy test, result negative: Secondary | ICD-10-CM

## 2021-11-06 DIAGNOSIS — Z113 Encounter for screening for infections with a predominantly sexual mode of transmission: Secondary | ICD-10-CM

## 2021-11-06 DIAGNOSIS — R110 Nausea: Secondary | ICD-10-CM | POA: Diagnosis not present

## 2021-11-06 LAB — POCT URINE PREGNANCY: Preg Test, Ur: NEGATIVE

## 2021-11-06 NOTE — Progress Notes (Signed)
° °  GYNECOLOGY PROGRESS NOTE  History:  21 y.o. G0P0000 presents to [facility] office today for problem gyn visit. She reports menses are 1 week late and she has nausea and vomiting. She had gyn visit scheduled initially for STI screening but wants to add pregnancy test to today's visit. She reports 3 negative home UPTs this week.  She denies abdominal pain.  The following portions of the patient's history were reviewed and updated as appropriate: allergies, current medications, past family history, past medical history, past social history, past surgical history and problem list. Recommend Pap screening to start at age 44.  Health Maintenance Due  Topic Date Due   CHLAMYDIA SCREENING  Never done   HIV Screening  Never done   COVID-19 Vaccine (3 - Booster for Pfizer series) 05/13/2020   Hepatitis C Screening  Never done   INFLUENZA VACCINE  04/23/2021     Review of Systems:  Pertinent items are noted in HPI.   Objective:  Physical Exam Blood pressure 119/76, pulse 92, height 5' (1.524 m), weight 238 lb (108 kg), last menstrual period 10/02/2021. VS reviewed, nursing note reviewed,  Constitutional: well developed, well nourished, no distress HEENT: normocephalic CV: normal rate Pulm/chest wall: normal effort Breast Exam: deferred Abdomen: soft Neuro: alert and oriented x 3 Skin: warm, dry Psych: affect normal Pelvic exam: Deferred  Assessment & Plan:  1. Late menses --Reviewed possible causes with pt including stress, weight gain/loss, normal variations at young age, etc.   --Reassurance provided. UPT negative in office today, hcg pending. --Pt to f/u in 2 months if menses continues to be delayed  - POCT urine pregnancy - Beta hCG quant (ref lab)  2. Nausea --May be associated with missed period, hormones can cause nausea as well even outside of pregnancy.  3. Routine screening for STI (sexually transmitted infection) - Cervicovaginal ancillary only( [place]) -  HepB+HepC+HIV Panel - [facility] - Cervicovaginal ancillary only( [place])   Fatima Blank, CNM 1:25 PM

## 2021-11-06 NOTE — Progress Notes (Signed)
21 y.o GYN Pt presents for STI screening, she requested HCG.  UPT today is Negative.

## 2021-11-07 ENCOUNTER — Encounter: Payer: Self-pay | Admitting: Advanced Practice Midwife

## 2021-11-07 LAB — CERVICOVAGINAL ANCILLARY ONLY
Chlamydia: NEGATIVE
Comment: NEGATIVE
Comment: NEGATIVE
Comment: NORMAL
Neisseria Gonorrhea: NEGATIVE
Trichomonas: NEGATIVE

## 2021-11-07 LAB — HEPB+HEPC+HIV PANEL
HIV Screen 4th Generation wRfx: NONREACTIVE
Hep B C IgM: NEGATIVE
Hep B Core Total Ab: NEGATIVE
Hep B E Ab: NEGATIVE
Hep B E Ag: NEGATIVE
Hep B Surface Ab, Qual: NONREACTIVE
Hep C Virus Ab: NONREACTIVE
Hepatitis B Surface Ag: NEGATIVE

## 2021-11-07 LAB — RPR: RPR Ser Ql: NONREACTIVE

## 2021-11-07 LAB — BETA HCG QUANT (REF LAB): hCG Quant: <1 m[IU]/mL

## 2021-12-03 ENCOUNTER — Telehealth: Payer: Medicaid Other | Admitting: Physician Assistant

## 2021-12-03 DIAGNOSIS — R110 Nausea: Secondary | ICD-10-CM | POA: Diagnosis not present

## 2021-12-03 MED ORDER — ONDANSETRON HCL 4 MG PO TABS: 4.0000 mg | ORAL_TABLET | Freq: Three times a day (TID) | ORAL | 0 refills | Status: AC | PRN

## 2021-12-03 NOTE — Progress Notes (Signed)

## 2021-12-16 ENCOUNTER — Ambulatory Visit
Admission: EM | Admit: 2021-12-16 | Discharge: 2021-12-16 | Disposition: A | Discharge status: Home / Self Care | Payer: Medicaid Other | Attending: Student | Admitting: Student

## 2021-12-16 ENCOUNTER — Other Ambulatory Visit: Payer: Self-pay

## 2021-12-16 ENCOUNTER — Encounter: Payer: Self-pay | Admitting: Emergency Medicine

## 2021-12-16 DIAGNOSIS — Z112 Encounter for screening for other bacterial diseases: Secondary | ICD-10-CM | POA: Insufficient documentation

## 2021-12-16 DIAGNOSIS — J029 Acute pharyngitis, unspecified: Secondary | ICD-10-CM | POA: Diagnosis present

## 2021-12-16 LAB — POCT RAPID STREP A (OFFICE): Rapid Strep A Screen: NEGATIVE

## 2021-12-16 MED ORDER — CETIRIZINE HCL 10 MG PO TABS: 10.0000 mg | ORAL_TABLET | Freq: Every day | ORAL | 2 refills | Status: DC

## 2021-12-16 NOTE — ED Provider Notes (Signed)
?[facility] ? ? ? ?CSN: [id number] ?Arrival date & time: 12/16/21  1109 ? ? ?  ? ?History   ?Chief Complaint ?Chief Complaint  ?Patient presents with  ? Sore Throat  ? ? ?HPI ?Lori [name] is a 21 y.o. female presenting with sore throat for 2 days.  History noncontributory.  Describes sore throat and pain with swallowing.  Denies any other symptoms including congestion, cough, fever/chills, nausea/vomiting/diarrhea.  Denies known sick contacts.  Denies allergic rhinitis.  Has attempted Mucinex without relief. ? ?HPI ? ?Past Medical History:  ?Diagnosis Date  ? Asthma   ? Eczema   ? Headache   ? ? ?Patient Active Problem List  ? Diagnosis Date Noted  ? Current mild episode of major depressive disorder without prior episode (HCC) 11/03/2020  ? Epigastric pain 11/03/2020  ? Macromastia 09/06/2020  ? 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 Migraine with aura and without status migrainosus, not intractable 09/06/2020  ? Dysmenorrhea in adolescent 09/06/2020  ? Musculoskeletal chest pain 03/03/2020  ? Non-allergic rhinitis 12/30/2018  ? Mild persistent asthma, uncomplicated 01/08/2018  ? Perennial allergic rhinitis 01/08/2018  ? Elevated hemoglobin A1c 12/18/2015  ? 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 Acanthosis 12/18/2015  ? Vitamin D insufficiency 12/18/2015  ? 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 ? ?Past Surgical History:  ?Procedure Laterality Date  ? TYMPANOSTOMY TUBE PLACEMENT    ? WISDOM TOOTH EXTRACTION    ? ? ?OB History   ? ? Gravida  ?0  ? Para  ?0  ? Term  ?0  ? Preterm  ?0  ? AB  ?0  ? Living  ?   ?  ? ? SAB  ?0  ? IAB  ?0  ? Ectopic  ?0  ? Multiple  ?   ? Live Births  ?   ?   ?  ?  ? ? ? ?Home Medications   ? ?Prior to Admission medications   ?Medication Sig Start Date End Date Taking? Authorizing Provider  ?cetirizine (ZYRTEC ALLERGY) 10 MG tablet Take 1 tablet (10 mg total) by mouth daily. 12/16/21  Yes Rhys Martini, PA-C  ?acetaminophen (TYLENOL) 325 MG tablet Take 2 tablets (650 mg total) by  mouth every 6 (six) hours as needed for mild pain, fever or headache. 10/04/16   Ihor Dow, Nadara Mustard, NP  ?albuterol (VENTOLIN HFA) 108 (90 Base) MCG/ACT inhaler Inhale 2 puffs into the lungs every 6 (six) hours as needed for wheezing or shortness of breath. 01/02/21   Alfonse Spruce, MD  ?benzonatate (TESSALON) 100 MG capsule Take 1-2 capsules (100-200 mg total) by mouth 3 (three) times daily as needed for cough. 09/01/21   Wallis Bamberg, PA-C  ?clobetasol ointment (TEMOVATE) 0.05 % Apply 1 application topically 2 (two) times daily. 10/05/20   Alfonse Spruce, MD  ?Drospirenone (SLYND) 4 MG TABS Take 1 tablet by mouth daily. 09/05/20   Leftwich-Kirby, Wilmer Floor, CNM  ?levocetirizine (XYZAL) 5 MG tablet Take 1 tablet (5 mg total) by mouth every evening. 10/28/20   Roxy Horseman, PA-C  ?methylPREDNISolone (MEDROL DOSEPAK) 4 MG TBPK tablet Take by mouth. 10/25/20   [provider]  ?montelukast (SINGULAIR) 10 MG tablet Take 1 tablet (10 mg total) by mouth at bedtime. 01/02/21   Alfonse Spruce, MD  ?ondansetron [facility]) 4 MG tablet Take 1 tablet (4 mg total) by mouth every 8 (eight) hours as needed for nausea or vomiting. 12/03/21   Margaretann Loveless, PA-C  ?promethazine-dextromethorphan (PROMETHAZINE-DM) 6.25-15  MG/5ML syrup Take 5 mLs by mouth at bedtime as needed for cough. 09/01/21   Wallis Bamberg, PA-C  ?pseudoephedrine (SUDAFED) 60 MG tablet Take 1 tablet (60 mg total) by mouth every 8 (eight) hours as needed for congestion. 09/01/21   Wallis Bamberg, PA-C  ?rizatriptan (MAXALT-MLT) 10 MG disintegrating tablet Take 1 tablet (10 mg total) by mouth as needed for migraine (take 1 tab at severe headache onset, you may repeat second dose after 2 hours if needed but not more than 2 tablets a day). May repeat in 2 hours if needed 10/20/20   Lezlie Lye, MD  ?sucralfate (CARAFATE) 1 g tablet Take 1 tablet (1 g total) by mouth 4 (four) times daily -  with meals and at bedtime. 10/28/20   Roxy Horseman, PA-C  ?topiramate (TOPAMAX) 25 MG tablet Take 2 tablets (50 mg total) by mouth at bedtime. Start with 25 mg tablet at bedtime for 3 days then continue on 50 mg daily at bedtime. 10/20/20   Lezlie Lye, MD  ?SUMAtriptan (IMITREX) 25 MG tablet Take by mouth. 07/27/20 10/20/20  [provider]  ? ? ?Family History ?Family History  ?Problem Relation Age of Onset  ? Hypertension Mother   ? Hyperlipidemia Father   ? Diabetes type II Father   ? Depression Father   ? Plantar fasciitis Father   ? Graves' disease Maternal Grandmother   ? Diabetes type II Maternal Grandmother   ? Hypertension Maternal Grandmother   ? Kidney Stones Maternal Grandmother   ? Stroke Maternal Grandfather   ? Hypertension Maternal Grandfather   ? Hyperlipidemia Maternal Grandfather   ? Heart attack Maternal Grandfather   ? Diabetes type II Maternal Grandfather   ? Diabetes Paternal Grandmother   ? Diabetes type II Paternal Grandmother   ? Hypertension Paternal Grandmother   ? Kidney failure Paternal Grandfather   ? Diabetic kidney disease Paternal Grandfather   ? Hypertension Paternal Grandfather   ? Diabetes type II Paternal Grandfather   ? Asthma Sister   ? Diabetes type I Maternal Great-grandmother   ? Obesity Sister 16  ? Vitamin D deficiency Sister 71  ?  (Acanthosis) Sister 65  ?  (Elevated hemoglobin A1c) Sister 12  ? ? ?Social History ?Social History  ? ?Tobacco Use  ? Smoking status: Never  ?  Passive exposure: Yes  ? Smokeless tobacco: Never  ? Tobacco comments:  ?  father smokes inside the home  ?Vaping Use  ? Vaping Use: Never used  ?Substance Use Topics  ? Alcohol use: No  ? Drug use: Not Currently  ?  Types: Marijuana  ?  Comment: once on 18th B-Day  ? ? ? ?Allergies   ?Other and Peanut-containing drug products ? ? ?Review of Systems ?Review of Systems  ?Constitutional:  Negative for appetite change, chills and fever.  ?HENT:  Positive for sore throat. Negative for congestion, ear pain, rhinorrhea, sinus pressure  and sinus pain.   ?Eyes:  Negative for redness and visual disturbance.  ?Respiratory:  Negative for cough, chest tightness, shortness of breath and wheezing.   ?Cardiovascular:  Negative for chest pain and palpitations.  ?Gastrointestinal:  Negative for abdominal pain, constipation, diarrhea, nausea and vomiting.  ?Genitourinary:  Negative for dysuria, frequency and urgency.  ?Musculoskeletal:  Negative for myalgias.  ?Neurological:  Negative for dizziness, weakness and headaches.  ?Psychiatric/Behavioral:  Negative for confusion.   ?All other systems reviewed and are negative. ? ? ?Physical Exam ?Triage Vital Signs ?ED Triage Vitals [  12/16/21 1251]  ?Enc Vitals Group  ?   BP 132/80  ?   Pulse Rate 93  ?   Resp 18  ?   Temp 98.4 ?F (36.9 ?C)  ?   Temp Source Oral  ?   SpO2 98 %  ?   Weight 230 lb (104.3 kg)  ?   Height 5' (1.524 m)  ?   Head Circumference   ?   Peak Flow   ?   Pain Score 5  ?   Pain Loc   ?   Pain Edu?   ?   Excl. in GC?   ? ?No data found. ? ?Updated Vital Signs ?BP 132/80 (BP Location: Right Arm)   Pulse 93   Temp 98.4 ?F (36.9 ?C) (Oral)   Resp 18   Ht 5' (1.524 m)   Wt 230 lb (104.3 kg)   LMP 12/13/2021 (Approximate)   SpO2 98%   BMI 44.92 kg/m?  ? ?Visual Acuity ?Right Eye Distance:   ?Left Eye Distance:   ?Bilateral Distance:   ? ?Right Eye Near:   ?Left Eye Near:    ?Bilateral Near:    ? ?Physical Exam ?Vitals reviewed.  ?Constitutional:   ?   General: She is not in acute distress. ?   Appearance: Normal appearance. She is not ill-appearing.  ?HENT:  ?   Head: Normocephalic and atraumatic.  ?   Right Ear: Tympanic membrane, ear canal and external ear normal. No tenderness. No middle ear effusion. There is no impacted cerumen. Tympanic membrane is not perforated, erythematous, retracted or bulging.  ?   Left Ear: Tympanic membrane, ear canal and external ear normal. No tenderness.  No middle ear effusion. There is no impacted cerumen. Tympanic membrane is not perforated, erythematous,  retracted or bulging.  ?   Nose: Nose normal. No congestion.  ?   Mouth/Throat:  ?   Mouth: Mucous membranes are moist.  ?   Pharynx: Uvula midline. No oropharyngeal exudate or posterior oropharyngeal erythema.

## 2021-12-16 NOTE — ED Triage Notes (Addendum)
Pt reports sore throat x2 days. Pt denies any other symptoms at this time.  ? ?Would like to be tested for strep. ?

## 2021-12-16 NOTE — Discharge Instructions (Addendum)
-  Zyrtec daily.  ?-Ibuprofen, Mucinex, etc.  ?-Follow-up if any changes: new fevers, trouble swallowing, etc.  ?

## 2021-12-18 LAB — CULTURE, GROUP A STREP (THRC)

## 2022-01-07 ENCOUNTER — Ambulatory Visit: Payer: Medicaid Other | Admitting: Advanced Practice Midwife

## 2022-01-07 NOTE — Progress Notes (Deleted)
? ?  GYNECOLOGY PROGRESS NOTE ? ?History:  ?21 y.o. G0P0000 presents to [facility] office today for follow up gyn visit. She was seen on 11/06/21 with report of late menses and n/v.  UPT was negative. ? ?She reports *****.  She denies h/a, dizziness, shortness of breath, n/v, or fever/chills.   ? ?The following portions of the patient's history were reviewed and updated as appropriate: allergies, current medications, past family history, past medical history, past social history, past surgical history and problem list. Last pap smear on *** was normal, *** HRHPV. ? ?Health Maintenance Due  ?Topic Date Due  ? COVID-19 Vaccine (3 - Booster for Pfizer series) 05/13/2020  ?  ? ?Review of Systems:  ?Pertinent items are noted in HPI. ?  ?Objective:  ?Physical Exam ?Last menstrual period 12/13/2021. ?VS reviewed, nursing note reviewed,  ?Constitutional: well developed, well nourished, no distress ?HEENT: normocephalic ?CV: normal rate ?Pulm/chest wall: normal effort ?Breast Exam: deferred ?Abdomen: soft ?Neuro: alert and oriented x 3 ?Skin: warm, dry ?Psych: affect normal ?Pelvic exam: Cervix pink, visually closed, without lesion, scant white creamy discharge, vaginal walls and external genitalia normal ?Bimanual exam: Cervix 0/long/high, firm, anterior, neg CMT, uterus nontender, nonenlarged, adnexa without tenderness, enlargement, or mass ? ?Assessment & Plan:  ?There are no diagnoses linked to this encounter. ? ?No follow-ups on file.  ? ?Sharen Counter, CNM ?8:31 AM  ?

## 2022-01-29 ENCOUNTER — Ambulatory Visit: Payer: Medicaid Other | Admitting: Advanced Practice Midwife

## 2022-02-04 ENCOUNTER — Ambulatory Visit (INDEPENDENT_AMBULATORY_CARE_PROVIDER_SITE_OTHER): Payer: Medicaid Other | Admitting: Advanced Practice Midwife

## 2022-02-04 ENCOUNTER — Ambulatory Visit: Payer: Medicaid Other | Admitting: Advanced Practice Midwife

## 2022-02-04 ENCOUNTER — Encounter: Payer: Self-pay | Admitting: Advanced Practice Midwife

## 2022-02-04 VITALS — BP 117/83 | HR 85 | Ht 60.0 in | Wt 237.3 lb

## 2022-02-04 DIAGNOSIS — Z975 Presence of (intrauterine) contraceptive device: Secondary | ICD-10-CM | POA: Insufficient documentation

## 2022-02-04 DIAGNOSIS — Z30017 Encounter for initial prescription of implantable subdermal contraceptive: Secondary | ICD-10-CM | POA: Diagnosis not present

## 2022-02-04 LAB — POCT URINE PREGNANCY: Preg Test, Ur: NEGATIVE

## 2022-02-04 MED ORDER — ETONOGESTREL 68 MG ~~LOC~~ IMPL
68.0000 mg | DRUG_IMPLANT | Freq: Once | SUBCUTANEOUS | Status: AC
  Administered 2022-02-04: 68 mg via SUBCUTANEOUS

## 2022-02-04 NOTE — Progress Notes (Signed)
GYN pt in office Nexplanon insert. No other concerns today.  ?

## 2022-02-04 NOTE — Progress Notes (Signed)
GYNECOLOGY CLINIC PROCEDURE NOTE ? ?Lori [name] is a 21 y.o. G0P0000 here for Nexplanon insertion.  No other gynecologic concerns.  Contraceptive counseling was done at previous visits and reviewed today. ? ?Nexplanon Insertion Procedure ?Patient identified, informed consent performed, consent signed.   Patient does understand that irregular bleeding is a very common side effect of this medication. She was advised to have backup contraception for one week after placement. Pregnancy test in clinic today was negative.  Appropriate time out taken.  Patient's left arm was prepped and draped in the usual sterile fashion.. The ruler used to measure and mark insertion area.  Patient was prepped with alcohol swab and then injected with 3 ml of 1% lidocaine.  She was prepped with betadine, Nexplanon removed from packaging,  Device confirmed in needle, then inserted full length of needle and withdrawn per handbook instructions. Nexplanon was able to palpated in the patient's arm; patient palpated the insert herself. There was minimal blood loss.  Patient insertion site covered with guaze and a pressure bandage to reduce any bruising.  The patient tolerated the procedure well and was given post procedure instructions.  ? ? ?Return for annual exam.  ? ?Fatima Blank, CNM ?2:41 PM  ?

## 2022-03-16 ENCOUNTER — Encounter (HOSPITAL_COMMUNITY): Payer: Self-pay | Admitting: Emergency Medicine

## 2022-03-16 ENCOUNTER — Other Ambulatory Visit: Payer: Self-pay

## 2022-03-16 ENCOUNTER — Emergency Department (HOSPITAL_COMMUNITY)
Admission: EM | Admit: 2022-03-16 | Discharge: 2022-03-16 | Disposition: A | Discharge status: Home / Self Care | Payer: Medicaid Other | Attending: Student | Admitting: Student

## 2022-03-16 ENCOUNTER — Emergency Department (HOSPITAL_COMMUNITY): Payer: Medicaid Other

## 2022-03-16 DIAGNOSIS — R0789 Other chest pain: Secondary | ICD-10-CM | POA: Insufficient documentation

## 2022-03-16 DIAGNOSIS — Z7722 Contact with and (suspected) exposure to environmental tobacco smoke (acute) (chronic): Secondary | ICD-10-CM | POA: Diagnosis not present

## 2022-03-16 DIAGNOSIS — Z91010 Allergy to peanuts: Secondary | ICD-10-CM | POA: Insufficient documentation

## 2022-03-16 DIAGNOSIS — J45909 Unspecified asthma, uncomplicated: Secondary | ICD-10-CM | POA: Insufficient documentation

## 2022-03-16 DIAGNOSIS — Z79899 Other long term (current) drug therapy: Secondary | ICD-10-CM | POA: Diagnosis not present

## 2022-03-16 HISTORY — DX: Gastro-esophageal reflux disease without esophagitis: K21.9

## 2022-03-16 MED ORDER — NAPROXEN 375 MG PO TABS: 375.0000 mg | ORAL_TABLET | Freq: Two times a day (BID) | ORAL | 0 refills | Status: AC

## 2022-03-16 MED ORDER — NAPROXEN 250 MG PO TABS
500.0000 mg | ORAL_TABLET | Freq: Once | ORAL | Status: AC
  Administered 2022-03-16: 500 mg via ORAL
  Filled 2022-03-16: qty 2

## 2022-03-16 MED ORDER — LIDOCAINE 5 % EX PTCH
1.0000 | MEDICATED_PATCH | CUTANEOUS | Status: DC
Start: 2022-03-16 — End: 2022-03-17
  Administered 2022-03-16: 1 via TRANSDERMAL
  Filled 2022-03-16: qty 1

## 2022-04-03 ENCOUNTER — Emergency Department (HOSPITAL_COMMUNITY): Payer: Medicaid Other

## 2022-04-03 ENCOUNTER — Emergency Department (HOSPITAL_COMMUNITY)
Admission: EM | Admit: 2022-04-03 | Discharge: 2022-04-03 | Disposition: A | Discharge status: Home / Self Care | Payer: Medicaid Other | Attending: Emergency Medicine | Admitting: Emergency Medicine

## 2022-04-03 ENCOUNTER — Encounter (HOSPITAL_COMMUNITY): Payer: Self-pay | Admitting: *Deleted

## 2022-04-03 ENCOUNTER — Other Ambulatory Visit: Payer: Self-pay

## 2022-04-03 DIAGNOSIS — R0789 Other chest pain: Secondary | ICD-10-CM | POA: Diagnosis present

## 2022-04-03 DIAGNOSIS — E876 Hypokalemia: Secondary | ICD-10-CM | POA: Diagnosis not present

## 2022-04-03 DIAGNOSIS — J45909 Unspecified asthma, uncomplicated: Secondary | ICD-10-CM | POA: Insufficient documentation

## 2022-04-03 DIAGNOSIS — R791 Abnormal coagulation profile: Secondary | ICD-10-CM | POA: Diagnosis not present

## 2022-04-03 DIAGNOSIS — Z91010 Allergy to peanuts: Secondary | ICD-10-CM | POA: Diagnosis not present

## 2022-04-03 DIAGNOSIS — R112 Nausea with vomiting, unspecified: Secondary | ICD-10-CM | POA: Diagnosis not present

## 2022-04-03 DIAGNOSIS — R079 Chest pain, unspecified: Secondary | ICD-10-CM

## 2022-04-03 DIAGNOSIS — R1013 Epigastric pain: Secondary | ICD-10-CM | POA: Diagnosis not present

## 2022-04-03 LAB — CBC WITH DIFFERENTIAL/PLATELET
Abs Immature Granulocytes: 0.02 10*3/uL (ref 0.00–0.07)
Basophils Absolute: 0 10*3/uL (ref 0.0–0.1)
Basophils Relative: 1 %
Eosinophils Absolute: 0.1 10*3/uL (ref 0.0–0.5)
Eosinophils Relative: 1 %
HCT: 39.9 % (ref 36.0–46.0)
Hemoglobin: 12.5 g/dL (ref 12.0–15.0)
Immature Granulocytes: 0 %
Lymphocytes Relative: 40 %
Lymphs Abs: 3.1 10*3/uL (ref 0.7–4.0)
MCH: 26.2 pg (ref 26.0–34.0)
MCHC: 31.3 g/dL (ref 30.0–36.0)
MCV: 83.6 fL (ref 80.0–100.0)
Monocytes Absolute: 0.5 10*3/uL (ref 0.1–1.0)
Monocytes Relative: 6 %
Neutro Abs: 4.1 10*3/uL (ref 1.7–7.7)
Neutrophils Relative %: 52 %
Platelets: 306 10*3/uL (ref 150–400)
RBC: 4.77 MIL/uL (ref 3.87–5.11)
RDW: 14.9 % (ref 11.5–15.5)
WBC: 7.8 10*3/uL (ref 4.0–10.5)
nRBC: 0 % (ref 0.0–0.2)

## 2022-04-03 LAB — COMPREHENSIVE METABOLIC PANEL
ALT: 12 U/L (ref 0–44)
AST: 14 U/L — ABNORMAL LOW (ref 15–41)
Albumin: 3.6 g/dL (ref 3.5–5.0)
Alkaline Phosphatase: 71 U/L (ref 38–126)
Anion gap: 6 (ref 5–15)
BUN: 8 mg/dL (ref 6–20)
CO2: 27 mmol/L (ref 22–32)
Calcium: 8.9 mg/dL (ref 8.9–10.3)
Chloride: 106 mmol/L (ref 98–111)
Creatinine, Ser: 0.8 mg/dL (ref 0.44–1.00)
GFR, Estimated: >60 mL/min (ref >60)
Glucose, Bld: 106 mg/dL — ABNORMAL HIGH (ref 70–99)
Potassium: 3.2 mmol/L — ABNORMAL LOW (ref 3.5–5.1)
Sodium: 139 mmol/L (ref 135–145)
Total Bilirubin: 0.5 mg/dL (ref 0.3–1.2)
Total Protein: 7.6 g/dL (ref 6.5–8.1)

## 2022-04-03 LAB — TROPONIN I (HIGH SENSITIVITY)
Troponin I (High Sensitivity): <2 ng/L (ref <18)
Troponin I (High Sensitivity): <2 ng/L (ref <18)

## 2022-04-03 LAB — MAGNESIUM: Magnesium: 2.1 mg/dL (ref 1.7–2.4)

## 2022-04-03 LAB — I-STAT BETA HCG BLOOD, ED (MC, WL, AP ONLY): I-stat hCG, quantitative: <5 m[IU]/mL (ref <5)

## 2022-04-03 LAB — LIPASE, BLOOD: Lipase: 33 U/L (ref 11–51)

## 2022-04-03 LAB — D-DIMER, QUANTITATIVE: D-Dimer, Quant: 1.4 ug/mL-FEU — ABNORMAL HIGH (ref 0.00–0.50)

## 2022-04-03 MED ORDER — LACTATED RINGERS IV BOLUS
1000.0000 mL | Freq: Once | INTRAVENOUS | Status: AC
  Administered 2022-04-03: 1000 mL via INTRAVENOUS

## 2022-04-03 MED ORDER — FAMOTIDINE IN NACL 20-0.9 MG/50ML-% IV SOLN
20.0000 mg | Freq: Once | INTRAVENOUS | Status: AC
  Administered 2022-04-03: 20 mg via INTRAVENOUS
  Filled 2022-04-03: qty 50

## 2022-04-03 MED ORDER — IOHEXOL 350 MG/ML SOLN
80.0000 mL | Freq: Once | INTRAVENOUS | Status: AC | PRN
  Administered 2022-04-03: 80 mL via INTRAVENOUS

## 2022-04-03 MED ORDER — ONDANSETRON HCL 4 MG/2ML IJ SOLN
4.0000 mg | Freq: Once | INTRAMUSCULAR | Status: AC
  Administered 2022-04-03: 4 mg via INTRAVENOUS
  Filled 2022-04-03: qty 2

## 2022-04-03 MED ORDER — METOCLOPRAMIDE HCL 5 MG/ML IJ SOLN: 10.0000 mg | Freq: Once | INTRAMUSCULAR | Status: DC

## 2022-04-03 MED ORDER — POTASSIUM CHLORIDE CRYS ER 20 MEQ PO TBCR
40.0000 meq | EXTENDED_RELEASE_TABLET | Freq: Once | ORAL | Status: AC
  Administered 2022-04-03: 40 meq via ORAL
  Filled 2022-04-03: qty 2

## 2022-04-03 MED ORDER — ALUM & MAG HYDROXIDE-SIMETH 200-200-20 MG/5ML PO SUSP
30.0000 mL | Freq: Once | ORAL | Status: AC
  Administered 2022-04-03: 30 mL via ORAL
  Filled 2022-04-03: qty 30

## 2022-04-03 MED ORDER — ACETAMINOPHEN 325 MG PO TABS
650.0000 mg | ORAL_TABLET | Freq: Once | ORAL | Status: AC
  Administered 2022-04-03: 650 mg via ORAL
  Filled 2022-04-03: qty 2

## 2022-04-03 MED ORDER — LIDOCAINE VISCOUS HCL 2 % MT SOLN
15.0000 mL | Freq: Once | OROMUCOSAL | Status: AC
  Administered 2022-04-03: 15 mL via ORAL
  Filled 2022-04-03: qty 15

## 2022-04-03 NOTE — ED Triage Notes (Signed)
Pt c/o epigastric pain which she correlates to chest pain and vomiting x 1 that started this morning. Denies diarrhea and fever.

## 2022-04-03 NOTE — ED Provider Notes (Signed)
[facility] EMERGENCY DEPARTMENT Provider Note   CSN: [id number] Arrival date & time: 04/03/22  0831     History  Chief Complaint  Patient presents with   Abdominal Pain   Chest Pain    Lori [name] is a 21 y.o. female.   Abdominal Pain Associated symptoms: chest pain, nausea and vomiting   Chest Pain Associated symptoms: abdominal pain, nausea and vomiting   Patient presents for chest pain.  Medical history includes depression, acid reflux, asthma, and chronic pain.  She was seen in the ED 3 weeks ago for chest pain.  She was treated for costochondritis.  She states that she discontinued use of her Naprosyn.  She has had ongoing mild chest pain since she was last seen.  She states that she was in her normal state of health yesterday.  Today she reports central chest pain that was present when she woke up.  She had an episode of vomiting.  She had some epigastric discomfort.  She was able to eat after she vomited with no further recurrence.  She did not take a dose of Zofran at home with relief of her nausea.  She denies any shortness of breath.  She has had recent intermittent cough.  Currently she endorses continued chest pain and epigastric discomfort.     Home Medications Prior to Admission medications   Medication Sig Start Date End Date Taking? Authorizing Provider  albuterol (VENTOLIN HFA) 108 (90 Base) MCG/ACT inhaler Inhale 2 puffs into the lungs every 6 (six) hours as needed for wheezing or shortness of breath. 01/02/21  Yes Alfonse Spruce, MD  acetaminophen (TYLENOL) 325 MG tablet Take 2 tablets (650 mg total) by mouth every 6 (six) hours as needed for mild pain, fever or headache. 10/04/16   Scoville, Nadara Mustard, NP  benzonatate (TESSALON) 100 MG capsule Take 1-2 capsules (100-200 mg total) by mouth 3 (three) times daily as needed for cough. 09/01/21   Wallis Bamberg, PA-C  cetirizine (ZYRTEC ALLERGY) 10 MG tablet Take 1 tablet (10 mg total) by mouth daily. 12/16/21    Rhys Martini, PA-C  clobetasol ointment (TEMOVATE) 0.05 % Apply 1 application topically 2 (two) times daily. 10/05/20   Alfonse Spruce, MD  levocetirizine (XYZAL) 5 MG tablet Take 1 tablet (5 mg total) by mouth every evening. Patient not taking: Reported on 02/04/2022 10/28/20   Roxy Horseman, PA-C  montelukast (SINGULAIR) 10 MG tablet Take 1 tablet (10 mg total) by mouth at bedtime. 01/02/21   Alfonse Spruce, MD  naproxen (NAPROSYN) 375 MG tablet Take 1 tablet (375 mg total) by mouth 2 (two) times daily. 03/16/22   Kommor, Madison, MD  ondansetron (ZOFRAN) 4 MG tablet Take 1 tablet (4 mg total) by mouth every 8 (eight) hours as needed for nausea or vomiting. 12/03/21   Margaretann Loveless, PA-C  promethazine-dextromethorphan (PROMETHAZINE-DM) 6.25-15 MG/5ML syrup Take 5 mLs by mouth at bedtime as needed for cough. 09/01/21   Wallis Bamberg, PA-C  pseudoephedrine (SUDAFED) 60 MG tablet Take 1 tablet (60 mg total) by mouth every 8 (eight) hours as needed for congestion. 09/01/21   Wallis Bamberg, PA-C  rizatriptan (MAXALT-MLT) 10 MG disintegrating tablet Take 1 tablet (10 mg total) by mouth as needed for migraine (take 1 tab at severe headache onset, you may repeat second dose after 2 hours if needed but not more than 2 tablets a day). May repeat in 2 hours if needed 10/20/20   Lezlie Lye, MD  sucralfate (CARAFATE)  1 g tablet Take 1 tablet (1 g total) by mouth 4 (four) times daily -  with meals and at bedtime. Patient not taking: Reported on 02/04/2022 10/28/20   Roxy Horseman, PA-C  topiramate (TOPAMAX) 25 MG tablet Take 2 tablets (50 mg total) by mouth at bedtime. Start with 25 mg tablet at bedtime for 3 days then continue on 50 mg daily at bedtime. 10/20/20   Abdelmoumen, Jenna Luo, MD  SUMAtriptan (IMITREX) 25 MG tablet Take by mouth. 07/27/20 10/20/20  [provider]      Allergies    Other and Peanut-containing drug products    Review of Systems   Review of Systems   Cardiovascular:  Positive for chest pain.  Gastrointestinal:  Positive for abdominal pain, nausea and vomiting.  All other systems reviewed and are negative.   Physical Exam Updated Vital Signs BP 108/71   Pulse 61   Temp 98.4 F (36.9 C) (Oral)   Resp 17   Ht 5' (1.524 m)   Wt 99.8 kg   LMP 01/30/2022 (Approximate)   SpO2 100%   BMI 42.97 kg/m  Physical Exam Vitals and nursing note reviewed.  Constitutional:      General: She is not in acute distress.    Appearance: She is well-developed. She is not ill-appearing, toxic-appearing or diaphoretic.  HENT:     Head: Normocephalic and atraumatic.     Mouth/Throat:     Mouth: Mucous membranes are moist.     Pharynx: Oropharynx is clear.  Eyes:     Conjunctiva/sclera: Conjunctivae normal.  Cardiovascular:     Rate and Rhythm: Normal rate and regular rhythm.     Heart sounds: No murmur heard. Pulmonary:     Effort: Pulmonary effort is normal. No respiratory distress.     Breath sounds: Normal breath sounds. No wheezing or rales.  Chest:     Chest wall: Tenderness present.  Abdominal:     Palpations: Abdomen is soft.     Tenderness: There is no abdominal tenderness.  Musculoskeletal:        General: No swelling.     Cervical back: Neck supple.  Skin:    General: Skin is warm and dry.     Coloration: Skin is not cyanotic, jaundiced or pale.  Neurological:     General: No focal deficit present.     Mental Status: She is alert and oriented to person, place, and time.  Psychiatric:        Mood and Affect: Mood normal.        Behavior: Behavior normal.     ED Results / Procedures / Treatments   Labs (all labs ordered are listed, but only abnormal results are displayed) Labs Reviewed  COMPREHENSIVE METABOLIC PANEL - Abnormal; Notable for the following components:      Result Value   Potassium 3.2 (*)    Glucose, Bld 106 (*)    AST 14 (*)    All other components within normal limits  D-DIMER, QUANTITATIVE -  Abnormal; Notable for the following components:   D-Dimer, Quant 1.40 (*)    All other components within normal limits  MAGNESIUM  LIPASE, BLOOD  CBC WITH DIFFERENTIAL/PLATELET  I-STAT BETA HCG BLOOD, ED ([facility], [facility], [facility] ONLY)  TROPONIN I (HIGH SENSITIVITY)  TROPONIN I (HIGH SENSITIVITY)    EKG EKG Interpretation  Date/Time:  Wednesday April 03 2022 08:50:14 EDT Ventricular Rate:  82 PR Interval:  119 QRS Duration: 80 QT Interval:  384 QTC Calculation: 449 R  Axis:   16 Text Interpretation: Sinus rhythm Borderline short PR interval Confirmed by Ross Marcus ([id number]) on 04/05/2022 6:53:27 AM  Radiology No results found.  Procedures Procedures    Medications Ordered in ED Medications  acetaminophen (TYLENOL) tablet 650 mg (650 mg Oral Given 04/03/22 0904)  alum & mag hydroxide-simeth (MAALOX/MYLANTA) 200-200-20 MG/5ML suspension 30 mL (30 mLs Oral Given 04/03/22 0929)    And  lidocaine (XYLOCAINE) 2 % viscous mouth solution 15 mL (15 mLs Oral Given 04/03/22 0929)  famotidine (PEPCID) IVPB 20 mg premix (0 mg Intravenous Stopped 04/03/22 1000)  lactated ringers bolus 1,000 mL (0 mLs Intravenous Stopped 04/03/22 1028)  ondansetron (ZOFRAN) injection 4 mg (4 mg Intravenous Given 04/03/22 1009)  iohexol (OMNIPAQUE) 350 MG/ML injection 80 mL (80 mLs Intravenous Contrast Given 04/03/22 1122)  potassium chloride SA (KLOR-CON M) CR tablet 40 mEq (40 mEq Oral Given 04/03/22 1228)    ED Course/ Medical Decision Making/ A&P                           Medical Decision Making Amount and/or Complexity of Data Reviewed Labs: ordered. Radiology: ordered.  Risk OTC drugs. Prescription drug management.   This patient presents to the ED for concern of chest pain, this involves an extensive number of treatment options, and is a complaint that carries with it a high risk of complications and morbidity.  The differential diagnosis includes costochondritis, pericarditis, myocarditis, pneumothorax,  PE, GERD, PUD   Co morbidities that complicate the patient evaluation  depression, acid reflux, asthma, and chronic pain   Additional history obtained:  Additional history obtained from N/A External records from outside source obtained and reviewed including EMR   Lab Tests:  I Ordered, and personally interpreted labs.  The pertinent results include: Hypokalemia and elevated D-dimer, otherwise normal findings   Imaging Studies ordered:  I ordered imaging studies including chest x-ray, CTA chest I independently visualized and interpreted imaging which showed no acute findings I agree with the radiologist interpretation   Cardiac Monitoring: / EKG:  The patient was maintained on a cardiac monitor.  I personally viewed and interpreted the cardiac monitored which showed an underlying rhythm of: Sinus rhythm  Problem List / ED Course / Critical interventions / Medication management  Patient is a 21 year old female presenting for central chest pain starting this morning upon awakening.  She reports that she was in her normal state of health yesterday.  She was seen in the ED 3 weeks ago for chest pain.  She was treated with Naprosyn for costochondritis.  She reports that she has had ongoing mild chest pain since that time.  The acute worsening occurred today.  With her chest pain, she did have nausea and vomiting.  She did take Zofran and had improvement in nausea.  She was able to tolerate p.o. intake prior to arrival.  She is well-appearing on exam.  Vital signs are normal.  EKG shows normal sinus rhythm without evidence of ST segment abnormalities.  Patient was given Tylenol for analgesia.  Laboratory work-up was initiated.  Lab work was notable for hypokalemia and elevated D-dimer.  CT of chest was ordered.  Potassium chloride was ordered for potassium replacement.  While in the ED, patient did have resolution of symptoms.  She was able to tolerate p.o. intake.  CTA of chest did not  show acute findings.  Patient was discharged in stable condition. I ordered medication including potassium chloride for hypokalemia;  Zofran for nausea; Tylenol and GI cocktail for analgesia Reevaluation of the patient after these medicines showed that the patient resolved I have reviewed the patients home medicines and have made adjustments as needed        Final Clinical Impression(s) / ED Diagnoses Final diagnoses:  Chest pain, unspecified type    Rx / DC Orders ED Discharge Orders     None         Gloris Manchester, MD 04/05/22 1152

## 2022-04-03 NOTE — Discharge Instructions (Signed)
Take Tylenol as needed at home for any return of pain.  Take Zofran as needed for nausea.  Return to the emergency department for any new or worsening symptoms of concern.

## 2022-07-02 ENCOUNTER — Encounter: Payer: Self-pay | Admitting: Obstetrics and Gynecology

## 2022-07-02 ENCOUNTER — Ambulatory Visit (INDEPENDENT_AMBULATORY_CARE_PROVIDER_SITE_OTHER): Payer: Medicaid Other | Admitting: Obstetrics and Gynecology

## 2022-07-02 VITALS — BP 124/83 | HR 92 | Ht 65.5 in | Wt 219.4 lb

## 2022-07-02 DIAGNOSIS — N911 Secondary amenorrhea: Secondary | ICD-10-CM | POA: Diagnosis not present

## 2022-07-02 NOTE — Progress Notes (Signed)
Patient presents for not having a cycle with nexplanon. Patient has no other concerns.

## 2022-07-02 NOTE — Progress Notes (Signed)
   Subjective:  CC: amenorrhea   Patient ID: Lori [name], female    DOB: 06/16/2001, 21 y.o.   MRN: [id number]  HPI Had a nexplanon placed in 01/2022 for contraception, previously using pills and in the interim has not had a menses. Was told during insertion she may not have a menses for a few months but wanted to be sure everything was ok. Currently sexually active with one female partner.   Review of Systems  Constitutional: Negative.   Respiratory:  Negative for shortness of breath.   Cardiovascular:  Negative for chest pain.  Gastrointestinal:  Negative for abdominal pain, constipation, diarrhea and nausea.  Genitourinary:  Positive for menstrual problem. Negative for pelvic pain, vaginal bleeding, vaginal discharge and vaginal pain.  All other systems reviewed and are negative.     Objective:  BP 124/83   Pulse 92   Ht 5' 5.5" (1.664 m)   Wt 219 lb 6.4 oz (99.5 kg)   BMI 35.95 kg/m   Physical Exam Vitals and nursing note reviewed.  Constitutional:      Appearance: Normal appearance.  HENT:     Head: Normocephalic and atraumatic.  Cardiovascular:     Rate and Rhythm: Normal rate and regular rhythm.  Pulmonary:     Effort: Pulmonary effort is normal.     Breath sounds: Normal breath sounds.  Skin:    General: Skin is warm and dry.  Neurological:     General: No focal deficit present.     Mental Status: She is alert.  Psychiatric:        Mood and Affect: Mood normal.        Behavior: Behavior normal.        Thought Content: Thought content normal.        Judgment: Judgment normal.       Assessment & Plan:   1. Secondary amenorrhea Amenorrhea secondary to nexplanon. Reviewed likely to continue to be amenorrheic with nexplaonn in place but there is possiblity of unscheduled bleeding in place. Can check home upt for reassurance. Reminded indicated for 3 years. Nexplanon only prevents pregnancy but does not prevent transmission of STIs.   Darliss Cheney,  MD 07/02/22

## 2022-07-15 ENCOUNTER — Other Ambulatory Visit (HOSPITAL_COMMUNITY)
Admission: RE | Admit: 2022-07-15 | Discharge: 2022-07-15 | Disposition: A | Discharge status: Home / Self Care | Payer: Medicaid Other | Source: Ambulatory Visit | Attending: Obstetrics and Gynecology | Admitting: Obstetrics and Gynecology

## 2022-07-15 ENCOUNTER — Ambulatory Visit (INDEPENDENT_AMBULATORY_CARE_PROVIDER_SITE_OTHER): Payer: Medicaid Other

## 2022-07-15 DIAGNOSIS — Z202 Contact with and (suspected) exposure to infections with a predominantly sexual mode of transmission: Secondary | ICD-10-CM | POA: Diagnosis present

## 2022-07-15 DIAGNOSIS — Z113 Encounter for screening for infections with a predominantly sexual mode of transmission: Secondary | ICD-10-CM

## 2022-07-15 MED ORDER — AZITHROMYCIN 500 MG PO TABS: 1000.0000 mg | ORAL_TABLET | Freq: Once | ORAL | 0 refills | Status: AC

## 2022-07-15 NOTE — Progress Notes (Signed)
Patient presents for Std screening. Patient states that her boyfriend tested positive for Chlamydia last week. Patient denies having any vaginal discharge, odor, irritation, or lower abdominal pain at this time. Desires to have Blood work completed as well. Desires to also be checked for yeast and bv as well.   Self swab completed.  Patient informed that results may take 24-48 hours to complete.  Patient sent azithromycin for treatment. Okay per Dr. Elly Modena

## 2022-07-16 LAB — CERVICOVAGINAL ANCILLARY ONLY
Chlamydia: POSITIVE — AB
Comment: NEGATIVE
Comment: NEGATIVE
Comment: NORMAL
Neisseria Gonorrhea: NEGATIVE
Trichomonas: NEGATIVE

## 2022-07-16 LAB — HEPATITIS B SURFACE ANTIGEN: Hepatitis B Surface Ag: NEGATIVE

## 2022-07-16 LAB — HIV ANTIBODY (ROUTINE TESTING W REFLEX): HIV Screen 4th Generation wRfx: NONREACTIVE

## 2022-07-16 LAB — RPR: RPR Ser Ql: NONREACTIVE

## 2022-07-16 LAB — HEPATITIS C ANTIBODY: Hep C Virus Ab: NONREACTIVE

## 2022-07-18 MED ORDER — DOXYCYCLINE HYCLATE 100 MG PO CAPS: 100.0000 mg | ORAL_CAPSULE | Freq: Two times a day (BID) | ORAL | 0 refills | Status: DC

## 2022-07-18 NOTE — Addendum Note (Signed)
Addended by: Mora Bellman on: 07/18/2022 02:05 PM   Modules accepted: Orders

## 2022-07-18 NOTE — Progress Notes (Signed)
TC to both numbers on file. No answer. HIPPA compliant VM left on each. MyChart message and education sent.

## 2022-07-19 ENCOUNTER — Telehealth: Payer: Self-pay

## 2022-07-19 NOTE — Telephone Encounter (Signed)
Attempted to contact about +CT results again, no answer left vm on both numbers

## 2022-07-19 NOTE — Telephone Encounter (Signed)
Pt called in and stated that she previously took azithromycin for =CT but threw it up right after. Advised pt that provider sent in Doxycycline and to take that medication as prescribed with food, f/u for TOC, pt voiced understanding

## 2022-11-04 ENCOUNTER — Ambulatory Visit: Payer: Medicaid Other | Admitting: Internal Medicine

## 2022-11-05 ENCOUNTER — Encounter: Payer: Self-pay | Admitting: Allergy & Immunology

## 2022-11-05 ENCOUNTER — Telehealth: Payer: Self-pay

## 2022-11-05 ENCOUNTER — Ambulatory Visit (INDEPENDENT_AMBULATORY_CARE_PROVIDER_SITE_OTHER): Payer: Medicaid Other | Admitting: Allergy & Immunology

## 2022-11-05 ENCOUNTER — Other Ambulatory Visit: Payer: Self-pay

## 2022-11-05 ENCOUNTER — Other Ambulatory Visit (HOSPITAL_COMMUNITY): Payer: Self-pay

## 2022-11-05 VITALS — BP 118/72 | HR 82 | Temp 98.2°F | Resp 16 | Ht 60.0 in | Wt 205.7 lb

## 2022-11-05 DIAGNOSIS — R079 Chest pain, unspecified: Secondary | ICD-10-CM | POA: Diagnosis not present

## 2022-11-05 DIAGNOSIS — J310 Chronic rhinitis: Secondary | ICD-10-CM | POA: Diagnosis not present

## 2022-11-05 DIAGNOSIS — J4530 Mild persistent asthma, uncomplicated: Secondary | ICD-10-CM

## 2022-11-05 MED ORDER — PREDNISONE 20 MG PO TABS: ORAL_TABLET | ORAL | 0 refills | Status: DC

## 2022-11-05 MED ORDER — LEVOCETIRIZINE DIHYDROCHLORIDE 5 MG PO TABS: 5.0000 mg | ORAL_TABLET | Freq: Every evening | ORAL | 1 refills | Status: DC

## 2022-11-05 MED ORDER — FLUTICASONE PROPIONATE 50 MCG/ACT NA SUSP: 2.0000 | Freq: Every day | NASAL | 5 refills | Status: AC

## 2022-11-05 MED ORDER — TRELEGY ELLIPTA 200-62.5-25 MCG/ACT IN AEPB: 1.0000 | INHALATION_SPRAY | Freq: Every day | RESPIRATORY_TRACT | 5 refills | Status: AC

## 2022-11-05 MED ORDER — EPINEPHRINE 0.3 MG/0.3ML IJ SOAJ: 0.3000 mg | Freq: Once | INTRAMUSCULAR | 2 refills | Status: AC

## 2022-11-05 MED ORDER — ALBUTEROL SULFATE HFA 108 (90 BASE) MCG/ACT IN AERS: 2.0000 | INHALATION_SPRAY | Freq: Four times a day (QID) | RESPIRATORY_TRACT | 2 refills | Status: AC | PRN

## 2022-11-05 NOTE — Telephone Encounter (Signed)
Received Trelegy Ellipta 200-62.5-25 mcg/act PA from [facility].  KEY: [id number]  Forwarding message to PA Team to begin PA.

## 2022-11-05 NOTE — Patient Instructions (Addendum)
1. Mild persistent asthma with chest pain - Lung testing looked good today. - we are going to get you back on asthma medications that you take on a routine basis.  - We are going to start Trelegy to see if this can prevent your chest pain episode.  - We are going to get some labs to make sure that those inflammatory markers have improved (they were high in 2021).  - Daily controller medication(s): Trelegy 275mg one puff once daily  - Prior to physical activity: albuterol 2 puffs 10-15 minutes before physical activity. - Rescue medications: albuterol 4 puffs every 4-6 hours as needed - Asthma control goals:  * Full participation in all desired activities (may need albuterol before activity) * Albuterol use two time or less a week on average (not counting use with activity) * Cough interfering with sleep two time or less a month * Oral steroids no more than once a year * No hospitalizations  2. Non-allergic rhinitis - Continue with: Xyzal (levocetirizine) 542mtablet once daily and Flonase (fluticasone) one spray per nostril daily - You can use an extra dose of the antihistamine, if needed, for breakthrough symptoms.  - Consider nasal saline rinses 1-2 times daily to remove allergens from the nasal cavities as well as help with mucous clearance (this is especially helpful to do before the nasal sprays are given) - Start the prednisone taper that I sent in to help with clearing of the left ear drum, which is full of fluid).   3. Peanut allergy - Continue with peanut avoidance as you are doing. - EpiPen training provided and EpiPen sent in.   4. Return in about 3 months (around 02/03/2023).    Please inform usKoreaf any Emergency Department visits, hospitalizations, or changes in symptoms. Call usKoreaefore going to the ED for breathing or allergy symptoms since we might be able to fit you in for a sick visit. Feel free to contact usKoreanytime with any questions, problems, or concerns.  It was a  pleasure to see you and your family again today!  Websites that have reliable patient information: 1. American Academy of Asthma, Allergy, and Immunology: www.aaaai.org 2. Food Allergy Research and Education (FARE): foodallergy.org 3. Mothers of Asthmatics: http://www.asthmacommunitynetwork.org 4. American College of Allergy, Asthma, and Immunology: www.acaai.org   COVID-19 Vaccine Information can be found at: [email or web address] questions related to vaccine distribution or appointments, please email [email or web address] or call [phone]    "Like" usKorean Facebook and Instagram for our latest updates!       Make sure you are registered to vote! If you have moved or changed any of your contact information, you will need to get this updated before voting!  In some cases, you MAY be able to register to vote online: [email or web address]

## 2022-11-05 NOTE — Progress Notes (Signed)
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 Mild persistent asthma with chest pain - Lung testing looked good today. - We are going to get you back on asthma medications that you take on a routine basis.  - We are going to start Trelegy to see if this can prevent your chest pain episode.  - We are going to get some labs to make sure that those inflammatory markers have improved (they were high in 2021).  - Daily controller medication(s): Trelegy 255mg one puff once daily  - Prior to physical activity: albuterol 2 puffs 10-15 minutes before physical activity. - Rescue medications: albuterol 4 puffs every 4-6 hours as needed - Asthma control goals:  * Full participation in all desired activities (may need albuterol before activity) * Albuterol use two time or less a week on average (not counting use with activity) * Cough interfering with sleep two time or less a month * Oral steroids no more than once a year * No hospitalizations  2. Non-allergic rhinitis - Continue with: Xyzal (levocetirizine) 554mtablet once daily and Flonase (fluticasone) one spray per nostril daily - You can use an extra dose of the antihistamine, if needed, for breakthrough symptoms.  - Consider nasal saline rinses 1-2 times daily to remove allergens from the nasal cavities as well as help with mucous clearance (this is especially helpful to do before the nasal sprays are given) - Start the prednisone taper that I sent in to help with clearing of the left ear drum, which is full of fluid).   3. Peanut allergy - Continue with peanut  avoidance as you are doing. - EpiPen training provided and EpiPen sent in.   4. Return in about 3 months (around 02/03/2023).   Subjective:   RaCatherine Mccreedys a [age].o. female presenting today for follow up of  Chief Complaint  Patient presents with   chedt pain   Ear clog    RaDelainy Schurtzas a history of the following: Patient Active Problem List   Diagnosis Date Noted   Nexplanon in place 02/04/2022   Current mild episode of major depressive disorder without prior episode [place]   Epigastric pain 11/03/2020   Macromastia 09/06/2020   Chronic midline thoracic back pain 09/06/2020   Migraine with aura and without status migrainosus, not intractable 09/06/2020   Dysmenorrhea in adolescent 09/06/2020   Musculoskeletal chest pain 03/03/2020   Non-allergic rhinitis 12/30/2018   Mild persistent asthma, uncomplicated [id number] Perennial allergic rhinitis 01/08/2018   Elevated hemoglobin A1c 12/18/2015   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  RaKaitlans a [age].o. female presenting for a follow up visit.  She was last seen in April 2022.  At that time, we added on omeprazole in the morning to see if that helped with her reflux.  We recommended that she keep her pulmonology appointment.  We added on a daily controller medication and continue with albuterol as needed.  For her rhinitis, we will continue  with Zyrtec as well as Singulair and Flonase.  She continue to avoid peanuts.   In the interim, she has continued to have issues.  Asthma/Respiratory Symptom History: Asthma has been a problem. She has been to the ED on a couple of occasions. She reports that she is having nearly daily chest pain. She has albuterol to use as needed. She uses her "pump" when she needs it. Her mother was reporting that this was controlled. She has albuterol  and uses it when she has the chest pain. She did see Cardiology in February 2022; workup was normal per the patient. She did have an EKG in July 2023 that was normal (show below):       Allergic Rhinitis Symptom History: She was having some ear clogging over the last several weeks. It started getting better around two weeks ago. She reports that when she is at work, she has a sensation that her ears are sensitive. She is working at [employer].  She does feel that her ears have evened out a bit. But she still has a sensation that it is clogged. It was painful at some point but this has resolved. She has not had any ear drainage. She denies fevers.   Food Allergy Symptom History: She is still avoiding peanuts. This causes her to have some coughing and throat swelling. Testing was done in January 2022 and this was completely negative.  She was eating small amounts of the peanuts without a problem. She tries to avoid them since she does not want to trigger her asthma.   GERD Symptom History: She last took the reflux medication a few days ago. She is only using this on a PRN basis. She reports that her acid reflux is not bad. When she works so much, her chest pain kicks in more and she started coughing and throwing up.  She does have Zofran that she uses as needed of nausea. She uses the Zofran once daily every day. This prescribed her gastroenterologist.   Otherwise, there have been no changes to her past medical history, surgical history, family history, or social history.    Review of Systems  Constitutional: Negative.  Negative for chills, fever, malaise/fatigue and weight loss.  HENT: Negative.  Negative for congestion, ear discharge, ear pain and sore throat.   Eyes:  Negative for pain, discharge and redness.  Respiratory:  Positive for cough and shortness of breath. Negative for sputum production and wheezing.   Cardiovascular: Negative.  Negative for chest pain and palpitations.   Gastrointestinal:  Negative for abdominal pain, constipation, diarrhea, heartburn, nausea and vomiting.  Skin: Negative.  Negative for itching and rash.  Neurological:  Negative for dizziness and headaches.  Endo/Heme/Allergies:  Negative for environmental allergies. Does not bruise/bleed easily.       Objective:   Blood pressure 118/72, pulse 82, temperature 98.2 F (36.8 C), temperature source Temporal, resp. rate 16, height 5' (1.524 m), weight 205 lb 11.2 oz (93.3 kg), SpO2 98 %. Body mass index is 40.17 kg/m.    Physical Exam Vitals reviewed.  Constitutional:      Appearance: She is well-developed.  HENT:     Head: Normocephalic and atraumatic.     Right Ear: Tympanic membrane, ear canal and external ear normal.     Left Ear: Tympanic membrane, ear canal and external ear normal.     Nose: No nasal deformity, septal deviation, mucosal edema or rhinorrhea.     Right Turbinates: Enlarged, swollen and pale.  Left Turbinates: Enlarged, swollen and pale.     Right Sinus: No maxillary sinus tenderness or frontal sinus tenderness.     Left Sinus: No maxillary sinus tenderness or frontal sinus tenderness.     Comments: No nasal polyps.     Mouth/Throat:     Lips: Pink.     Mouth: Mucous membranes are moist. Mucous membranes are not pale and not dry.     Pharynx: Uvula midline.  Eyes:     General: Lids are normal. Allergic shiner present.        Right eye: No discharge.        Left eye: No discharge.     Conjunctiva/sclera: Conjunctivae normal.     Right eye: Right conjunctiva is not injected. No chemosis.    Left eye: Left conjunctiva is not injected. No chemosis.    Pupils: Pupils are equal, round, and reactive to light.  Cardiovascular:     Rate and Rhythm: Normal rate and regular rhythm.     Heart sounds: Normal heart sounds.  Pulmonary:     Effort: Pulmonary effort is normal. No tachypnea, accessory muscle usage or respiratory distress.     Breath sounds: Normal  breath sounds. Transmitted upper airway sounds present. No wheezing, rhonchi or rales.     Comments: Moving air well in all lung fields. No increased work of breathing noted.  Chest:     Chest wall: No tenderness.  Lymphadenopathy:     Cervical: No cervical adenopathy.  Skin:    General: Skin is warm.     Capillary Refill: Capillary refill takes less than 2 seconds.     Coloration: Skin is not ashen, jaundiced or pale.     Findings: No abrasion, erythema, petechiae or rash. Rash is not papular, urticarial or vesicular.  Neurological:     Mental Status: She is alert.  Psychiatric:        Behavior: Behavior is cooperative.      Diagnostic studies:    Spirometry: results normal (FEV1: 2.40/94%, FVC: 3.16/111%, FEV1/FVC: 76%).    Spirometry consistent with normal pattern.   Allergy Studies: none         Salvatore Marvel, MD  [facility]

## 2022-11-06 LAB — CBC WITH DIFFERENTIAL
Basophils Absolute: 0 10*3/uL (ref 0.0–0.2)
Basos: 0 %
EOS (ABSOLUTE): 0 10*3/uL (ref 0.0–0.4)
Eos: 0 %
Hematocrit: 40.1 % (ref 34.0–46.6)
Hemoglobin: 12.5 g/dL (ref 11.1–15.9)
Immature Grans (Abs): 0 10*3/uL (ref 0.0–0.1)
Immature Granulocytes: 0 %
Lymphocytes Absolute: 2.5 10*3/uL (ref 0.7–3.1)
Lymphs: 39 %
MCH: 26.3 pg — ABNORMAL LOW (ref 26.6–33.0)
MCHC: 31.2 g/dL — ABNORMAL LOW (ref 31.5–35.7)
MCV: 84 fL (ref 79–97)
Monocytes Absolute: 0.3 10*3/uL (ref 0.1–0.9)
Monocytes: 5 %
Neutrophils Absolute: 3.5 10*3/uL (ref 1.4–7.0)
Neutrophils: 56 %
RBC: 4.76 x10E6/uL (ref 3.77–5.28)
RDW: 13.9 % (ref 11.7–15.4)
WBC: 6.3 10*3/uL (ref 3.4–10.8)

## 2022-11-07 ENCOUNTER — Ambulatory Visit
Admission: EM | Admit: 2022-11-07 | Discharge: 2022-11-07 | Disposition: A | Discharge status: Home / Self Care | Payer: Medicaid Other | Attending: Family Medicine | Admitting: Family Medicine

## 2022-11-07 DIAGNOSIS — R112 Nausea with vomiting, unspecified: Secondary | ICD-10-CM | POA: Insufficient documentation

## 2022-11-07 DIAGNOSIS — J069 Acute upper respiratory infection, unspecified: Secondary | ICD-10-CM | POA: Insufficient documentation

## 2022-11-07 DIAGNOSIS — Z1152 Encounter for screening for COVID-19: Secondary | ICD-10-CM | POA: Diagnosis not present

## 2022-11-07 DIAGNOSIS — J4541 Moderate persistent asthma with (acute) exacerbation: Secondary | ICD-10-CM | POA: Insufficient documentation

## 2022-11-07 LAB — TRYPTASE: Tryptase: 4.2 ug/L (ref 2.2–13.2)

## 2022-11-07 LAB — POCT INFLUENZA A/B
Influenza A, POC: NEGATIVE
Influenza B, POC: NEGATIVE

## 2022-11-07 LAB — SEDIMENTATION RATE: Sed Rate: 49 mm/hr — ABNORMAL HIGH (ref 0–32)

## 2022-11-07 LAB — C-REACTIVE PROTEIN: CRP: 36 mg/L — ABNORMAL HIGH (ref 0–10)

## 2022-11-07 LAB — RHEUMATOID FACTOR: Rheumatoid fact SerPl-aCnc: <10 IU/mL (ref <14.0)

## 2022-11-07 LAB — ANTINUCLEAR ANTIBODIES, IFA: ANA Titer 1: NEGATIVE

## 2022-11-07 MED ORDER — ONDANSETRON 4 MG PO TBDP
4.0000 mg | ORAL_TABLET | Freq: Once | ORAL | Status: AC
  Administered 2022-11-07: 4 mg via ORAL

## 2022-11-07 MED ORDER — ONDANSETRON 4 MG PO TBDP: 4.0000 mg | ORAL_TABLET | Freq: Three times a day (TID) | ORAL | 0 refills | Status: AC | PRN

## 2022-11-07 MED ORDER — DEXAMETHASONE SODIUM PHOSPHATE 10 MG/ML IJ SOLN
10.0000 mg | Freq: Once | INTRAMUSCULAR | Status: AC
  Administered 2022-11-07: 10 mg via INTRAMUSCULAR

## 2022-11-07 NOTE — ED Triage Notes (Signed)
Pt reports chest pain, sob, N/V x 2 days.  Pt states she does have asthma and has used her inhaler but no relief.

## 2022-11-08 ENCOUNTER — Telehealth (INDEPENDENT_AMBULATORY_CARE_PROVIDER_SITE_OTHER): Payer: Medicaid Other | Admitting: Gastroenterology

## 2022-11-08 DIAGNOSIS — R0789 Other chest pain: Secondary | ICD-10-CM | POA: Diagnosis not present

## 2022-11-08 LAB — SARS CORONAVIRUS 2 (TAT 6-24 HRS): SARS Coronavirus 2: NEGATIVE

## 2022-11-08 NOTE — Progress Notes (Signed)
Jonathon Bellows , MD [address]  [place]  [place], [place] [address]  Main: [phone]  Fax: [phone]   Primary Care Physician: Pcp, No  Virtual Visit via Video Note  I connected with patient on 11/08/22 at 12:00 PM EST by video and verified that I am speaking with the correct person using two identifiers.   I discussed the limitations, risks, security and privacy concerns of performing an evaluation and management service by video  and the availability of in person appointments. I also discussed with the patient that there may be a patient responsible charge related to this service. The patient expressed understanding and agreed to proceed.  Location of Patient: Home Location of Provider: Home Persons involved: Patient and provider only   History of Present Illness: No chief complaint on file.   HPI: Lori [name] is a 22 y.o. female  Summary of history :   Initially seen and referred on 01/09/2021 for epigastric pain.  Her symptoms in the past have been attributed to use of marijuana when she developed nausea.  Good improvement in symptoms when she went off marijuana and took Prilosec.  Prior history also suggestive of acid reflux.   Interval history 10/22/2021-11/08/2022   Has chest pains and has been throwing up , Monday night had diarrhea with blood , stopped the next day.    Presently has no blood in stool but diarrhea is better.   She has had chest pain for a few months , day and night , describes as someone on her chest , worse when she is sitting up or walking or moving , it gets better when she is laying down .    She  saw her physician for the chest pains and was told she had inflammation in her chest . Stopped marijuana      Current Outpatient Medications  Medication Sig Dispense Refill   acetaminophen (TYLENOL) 325 MG tablet Take 2 tablets (650 mg total) by mouth every 6 (six) hours as needed for mild pain, fever or headache. 30 tablet 0    albuterol (VENTOLIN HFA) 108 (90 Base) MCG/ACT inhaler Inhale 2 puffs into the lungs every 6 (six) hours as needed for wheezing or shortness of breath. 8 g 2   benzonatate (TESSALON) 100 MG capsule Take 1-2 capsules (100-200 mg total) by mouth 3 (three) times daily as needed for cough. 60 capsule 0   clobetasol ointment (TEMOVATE) [id number] % Apply 1 application topically 2 (two) times daily. 30 g 0   fluticasone (FLONASE) 50 MCG/ACT nasal spray Place 2 sprays into both nostrils daily. 1 g 5   Fluticasone-Umeclidin-Vilant (TRELEGY ELLIPTA) 200-62.5-25 MCG/ACT AEPB Inhale 1 puff into the lungs daily. 28 each 5   levocetirizine (XYZAL) 5 MG tablet Take 1 tablet (5 mg total) by mouth every evening. 90 tablet 1   naproxen (NAPROSYN) 375 MG tablet Take 1 tablet (375 mg total) by mouth 2 (two) times daily. 20 tablet 0   ondansetron (ZOFRAN) 4 MG tablet Take 1 tablet (4 mg total) by mouth every 8 (eight) hours as needed for nausea or vomiting. 20 tablet 0   ondansetron (ZOFRAN-ODT) 4 MG disintegrating tablet Take 1 tablet (4 mg total) by mouth every 8 (eight) hours as needed for nausea or vomiting. 20 tablet 0   predniSONE (DELTASONE) 20 MG tablet Take 3 tabs (36m) twice daily for 3 days, then 2 tabs (224m twice daily for 3 days, then 1 tab (1042mtwice daily for 3  days, then STOP. 36 tablet 0   pseudoephedrine (SUDAFED) 60 MG tablet Take 1 tablet (60 mg total) by mouth every 8 (eight) hours as needed for congestion. 30 tablet 0   topiramate (TOPAMAX) 25 MG tablet Take 2 tablets (50 mg total) by mouth at bedtime. Start with 25 mg tablet at bedtime for 3 days then continue on 50 mg daily at bedtime. 60 tablet 3   No current facility-administered medications for this visit.    Allergies as of 11/08/2022 - Review Complete 11/07/2022  Allergen Reaction Noted   Other Anaphylaxis 08/23/2020   Peanut-containing drug products  10/02/2020    Review of Systems:    All systems reviewed and negative except where  noted in HPI.  General Appearance:    Alert, cooperative, no distress, appears stated age  Head:    Normocephalic, without obvious abnormality, atraumatic  Eyes:    PERRL, conjunctiva/corneas clear,  Ears:    Grossly normal hearing    Neurologic:  Grossly normal    Observations/Objective:  Labs: CMP     Component Value Date/Time   NA 139 04/03/2022 0849   K 3.2 (L) 04/03/2022 0849   CL 106 04/03/2022 0849   CO2 27 04/03/2022 0849   GLUCOSE 106 (H) 04/03/2022 0849   BUN 8 04/03/2022 0849   CREATININE 0.80 04/03/2022 0849   CALCIUM 8.9 04/03/2022 0849   PROT 7.6 04/03/2022 0849   ALBUMIN 3.6 04/03/2022 0849   AST 14 (L) 04/03/2022 0849   ALT 12 04/03/2022 0849   ALKPHOS 71 04/03/2022 0849   BILITOT 0.5 04/03/2022 0849   GFRNONAA >60 04/03/2022 0849   GFRAA NOT CALCULATED 01/25/2020 1427   Lab Results  Component Value Date   WBC 6.3 11/05/2022   HGB 12.5 11/05/2022   HCT 40.1 11/05/2022   MCV 84 11/05/2022   PLT 306 04/03/2022    Imaging Studies: No results found.  Assessment and Plan:   Lori [name] is a 22 y.o. y/o female here to follow-up for acid reflux and prior history of marijuana associated nausea.  She says that for the past 1 month she has been having chest tightness and was told by her doctor she has inflammation in her chest.  The history and description of the chest discomfort does not sound associated with acid reflux.  She has stopped using marijuana.  She had bloody diarrhea for 1 or 2 days.  Since has resolved not recurred.  Likely effects of colitis versus hemorrhoids.   Plan 1.  Start Prilosec 40 mg once a day OTC for thing in the morning on an empty stomach for the chest discomfort but she should follow-up with her primary care physician for further evaluation to ensure it is not related to the lungs or  the heart 2.  Continue to stay off all marijuana 3.  Watch stool for any blood or diarrhea or change in bowel habits we will reassess in 6 weeks  if any of these issues persist we may need endoscopy and colonoscopy.  Follow-up in 6 weeks virtual visit        I discussed the assessment and treatment plan with the patient. The patient was provided an opportunity to ask questions and all were answered. The patient agreed with the plan and demonstrated an understanding of the instructions.   The patient was advised to call back or seek an in-person evaluation if the symptoms worsen or if the condition fails to improve as anticipated.  I provided 8 minutes  of face-to-face time during this encounter.  Dr Jonathon Bellows MD,MRCP [facility]) Gastroenterology/Hepatology Pager: [phone]   Speech recognition software was used to dictate this note.

## 2022-11-11 NOTE — ED Provider Notes (Signed)
[facility]    CSN: [id number] Arrival date & time: 11/07/22  1800      History   Chief Complaint No chief complaint on file.   HPI Lori [name] is a 22 y.o. female.   Presenting today with 2 day history of chest tightness, N/V, SOB, cough. Denies fever, chills, abdominal pain, congestion, N/V/D. So far trying her albuterol with minimal relief. Hx of asthma. No known sick contacts.     Past Medical History:  Diagnosis Date   Acid reflux    Asthma    Eczema    Headache     Patient Active Problem List   Diagnosis Date Noted   Nexplanon in place 02/04/2022   Current mild episode of major depressive disorder without prior episode (Why) 11/03/2020   Epigastric pain 11/03/2020   Macromastia 09/06/2020   Chronic midline thoracic back pain 09/06/2020   Migraine with aura and without status migrainosus, not intractable 09/06/2020   Dysmenorrhea in adolescent 09/06/2020   Musculoskeletal chest pain 03/03/2020   Non-allergic rhinitis 12/30/2018   Mild persistent asthma, uncomplicated [id number]   Perennial allergic rhinitis 01/08/2018   Elevated hemoglobin A1c 12/18/2015   Pediatric obesity 12/18/2015   Acanthosis 12/18/2015   Vitamin D insufficiency 12/18/2015   Onychodystrophy 10/06/2013    Past Surgical History:  Procedure Laterality Date   TYMPANOSTOMY TUBE PLACEMENT     WISDOM TOOTH EXTRACTION      OB History     Gravida  0   Para  0   Term  0   Preterm  0   AB  0   Living         SAB  0   IAB  0   Ectopic  0   Multiple      Live Births               Home Medications    Prior to Admission medications   Medication Sig Start Date End Date Taking? Authorizing Provider  ondansetron (ZOFRAN-ODT) 4 MG disintegrating tablet Take 1 tablet (4 mg total) by mouth every 8 (eight) hours as needed for nausea or vomiting. 11/07/22  Yes Volney American, PA-C  acetaminophen (TYLENOL) 325 MG tablet Take 2 tablets (650 mg total)  by mouth every 6 (six) hours as needed for mild pain, fever or headache. 10/04/16   [place]oville, Kennis Carina, NP  albuterol (VENTOLIN HFA) 108 (90 Base) MCG/ACT inhaler Inhale 2 puffs into the lungs every 6 (six) hours as needed for wheezing or shortness of breath. 11/05/22   Valentina Shaggy, MD  benzonatate (TESSALON) 100 MG capsule Take 1-2 capsules (100-200 mg total) by mouth 3 (three) times daily as needed for cough. 09/01/21   Jaynee Eagles, PA-C  clobetasol ointment (TEMOVATE) [id number] % Apply 1 application topically 2 (two) times daily. 10/05/20   Valentina Shaggy, MD  fluticasone [facility]) 50 MCG/ACT nasal spray Place 2 sprays into both nostrils daily. 11/05/22   Valentina Shaggy, MD  Fluticasone-Umeclidin-Vilant (TRELEGY ELLIPTA) 200-62.5-25 MCG/ACT AEPB Inhale 1 puff into the lungs daily. 11/05/22   Valentina Shaggy, MD  levocetirizine (XYZAL) 5 MG tablet Take 1 tablet (5 mg total) by mouth every evening. 11/05/22 02/03/23  Valentina Shaggy, MD  naproxen (NAPROSYN) 375 MG tablet Take 1 tablet (375 mg total) by mouth 2 (two) times daily. 03/16/22   Kommor, [place], MD  ondansetron (ZOFRAN) 4 MG tablet Take 1 tablet (4 mg total) by mouth every 8 (eight) hours as  needed for nausea or vomiting. 12/03/21   Mar Daring, PA-C  predniSONE (DELTASONE) 20 MG tablet Take 3 tabs (51m) twice daily for 3 days, then 2 tabs (274m twice daily for 3 days, then 1 tab (1026mtwice daily for 3 days, then STOP. 11/05/22   GalValentina ShaggyD  pseudoephedrine (SUDAFED) 60 MG tablet Take 1 tablet (60 mg total) by mouth every 8 (eight) hours as needed for congestion. 09/01/21   ManJaynee EaglesA-C  topiramate (TOPAMAX) 25 MG tablet Take 2 tablets (50 mg total) by mouth at bedtime. Start with 25 mg tablet at bedtime for 3 days then continue on 50 mg daily at bedtime. 10/20/20   Abdelmoumen, ImaJohnnette BarriosD  SUMAtriptan (IMITREX) 25 MG tablet Take by mouth. 07/27/20 10/20/20  [provider]     Family History Family History  Problem Relation Age of Onset   Hypertension Mother    Hyperlipidemia Father    Diabetes type II Father    Depression Father    Plantar fasciitis Father    Graves' disease Maternal Grandmother    Diabetes type II Maternal Grandmother    Hypertension Maternal Grandmother    Kidney Stones Maternal Grandmother    Stroke Maternal Grandfather    Hypertension Maternal Grandfather    Hyperlipidemia Maternal Grandfather    Heart attack Maternal Grandfather    Diabetes type II Maternal Grandfather    Diabetes Paternal Grandmother    Diabetes type II Paternal Grandmother    Hypertension Paternal Grandmother    Kidney failure Paternal Grandfather    Diabetic kidney disease Paternal Grandfather    Hypertension Paternal Grandfather    Diabetes type II Paternal Grandfather    Asthma Sister    Diabetes type I Maternal Great-grandmother    Obesity Sister 12 44Vitamin D deficiency Sister 12 67 (Acanthosis) Sister 12 92 (Elevated hemoglobin A1c) Sister 12 54 Social History Social History   Tobacco Use   Smoking status: Never    Passive exposure: Yes   Smokeless tobacco: Never   Tobacco comments:    father smokes inside the home  Vaping Use   Vaping Use: Never used  Substance Use Topics   Alcohol use: Yes    Comment: weekends   Drug use: Yes    Types: Marijuana    Comment: daily     Allergies   Other and Peanut-containing drug products   Review of Systems Review of Systems PER HPI  Physical Exam Triage Vital Signs ED Triage Vitals  Enc Vitals Group     BP 11/07/22 1811 116/81     Pulse Rate 11/07/22 1811 96     Resp 11/07/22 1811 20     Temp 11/07/22 1811 99.1 F (37.3 C)     Temp Source 11/07/22 1811 Oral     SpO2 11/07/22 1811 98 %     Weight --      Height --      Head Circumference --      Peak Flow --      Pain Score 11/07/22 1813 8     Pain Loc --      Pain Edu? --      Excl. in [place]-    No data found.  Updated  Vital Signs BP 116/81 (BP Location: Right Arm)   Pulse 96   Temp 99.1 F (37.3 C) (Oral)   Resp 20   LMP  (LMP Unknown) Comment: pt has the  nexplanon implant  SpO2 98%   Visual Acuity Right Eye Distance:   Left Eye Distance:   Bilateral Distance:    Right Eye Near:   Left Eye Near:    Bilateral Near:     Physical Exam Vitals and nursing note reviewed.  Constitutional:      Appearance: Normal appearance.  HENT:     Head: Atraumatic.     Right Ear: Tympanic membrane and external ear normal.     Left Ear: Tympanic membrane and external ear normal.     Nose: Rhinorrhea present.     Mouth/Throat:     Mouth: Mucous membranes are moist.     Pharynx: Posterior oropharyngeal erythema present.  Eyes:     Extraocular Movements: Extraocular movements intact.     Conjunctiva/sclera: Conjunctivae normal.  Cardiovascular:     Rate and Rhythm: Normal rate and regular rhythm.     Heart sounds: Normal heart sounds.  Pulmonary:     Effort: Pulmonary effort is normal.     Breath sounds: Normal breath sounds. No wheezing or rales.  Abdominal:     General: Bowel sounds are normal. There is no distension.     Palpations: Abdomen is soft.     Tenderness: There is no abdominal tenderness. There is no right CVA tenderness, left CVA tenderness or guarding.  Musculoskeletal:        General: Normal range of motion.     Cervical back: Normal range of motion and neck supple.  Skin:    General: Skin is warm and dry.  Neurological:     Mental Status: She is alert and oriented to person, place, and time.  Psychiatric:        Mood and Affect: Mood normal.        Thought Content: Thought content normal.      UC Treatments / Results  Labs (all labs ordered are listed, but only abnormal results are displayed) Labs Reviewed  SARS CORONAVIRUS 2 (TAT 6-24 HRS)  POCT INFLUENZA A/B    EKG   Radiology No results found.  Procedures Procedures (including critical care time)  Medications  Ordered in UC Medications  ondansetron (ZOFRAN-ODT) disintegrating tablet 4 mg (4 mg Oral Given 11/07/22 1810)  dexamethasone (DECADRON) injection 10 mg (10 mg Intramuscular Given 11/07/22 2011)    Initial Impression / Assessment and Plan / UC Course  I have reviewed the triage vital signs and the nursing notes.  Pertinent labs & imaging results that were available during my care of the patient were reviewed by me and considered in my medical decision making (see chart for details).     Rapid flu neg, vitals and exam overall reassuring, COVID pending. Treat for viral URI with asthma exacerbation with IM decadron, zofran, albuterol and supportive OTC medications and home care. Return for worsening sxs. Work note given  Final Clinical Impressions(s) / UC Diagnoses   Final diagnoses:  Viral URI  Moderate persistent asthma with acute exacerbation  Nausea and vomiting, unspecified vomiting type   Discharge Instructions   None    ED Prescriptions     Medication Sig Dispense Auth. Provider   ondansetron (ZOFRAN-ODT) 4 MG disintegrating tablet Take 1 tablet (4 mg total) by mouth every 8 (eight) hours as needed for nausea or vomiting. 20 tablet Volney American, [place]      PDMP not reviewed this encounter.   Volney American, [place] 11/11/22 2249

## 2022-11-12 ENCOUNTER — Other Ambulatory Visit (HOSPITAL_COMMUNITY): Payer: Self-pay

## 2022-11-12 ENCOUNTER — Telehealth: Payer: Self-pay

## 2022-11-12 NOTE — Telephone Encounter (Signed)
Patient Advocate Encounter   Received notification from [facility] that prior authorization is required for Trelegy Ellipta 200-62.5-25MCG/ACT aerosol powder  Submitted: 11-12-2022 [facility]  Status is pending

## 2022-11-13 NOTE — Telephone Encounter (Signed)
PA has been DENIED due to:   Per the health plan's preferred drug list, at least 2 preferred drugs must be tried before requesting this drug  or tell us why the member cannot try any preferred alternatives. Please send [place] supporting chart notes and  lab results. Here is list of preferred alternatives: Advair Diskus, Advair HFA Inhaler, Dulera Inhaler, Symbicort  Inhaler.

## 2022-11-13 NOTE — Telephone Encounter (Signed)
PA for Trelegy has been denied. Preferred alternatives are Advair Diskus, Advair HFA, Dulera, and Symbicort.

## 2022-11-14 ENCOUNTER — Other Ambulatory Visit: Payer: Self-pay | Admitting: *Deleted

## 2022-11-14 MED ORDER — SYMBICORT 160-4.5 MCG/ACT IN AERO: 2.0000 | INHALATION_SPRAY | Freq: Two times a day (BID) | RESPIRATORY_TRACT | 5 refills | Status: AC

## 2022-11-14 NOTE — Telephone Encounter (Signed)
New prescription has been sent in. Called and left a voicemail asking for patient to return call to inform.

## 2022-11-14 NOTE — Telephone Encounter (Signed)
Let's change to Symbicort 160 mcg 2 puffs twice daily.

## 2022-11-15 ENCOUNTER — Ambulatory Visit
Admission: EM | Admit: 2022-11-15 | Discharge: 2022-11-15 | Disposition: A | Discharge status: Home / Self Care | Payer: Medicaid Other | Attending: Family Medicine | Admitting: Family Medicine

## 2022-11-15 ENCOUNTER — Other Ambulatory Visit: Payer: Self-pay

## 2022-11-15 ENCOUNTER — Telehealth: Payer: Medicaid Other | Admitting: Physician Assistant

## 2022-11-15 ENCOUNTER — Encounter: Payer: Self-pay | Admitting: Allergy & Immunology

## 2022-11-15 DIAGNOSIS — R0789 Other chest pain: Secondary | ICD-10-CM | POA: Diagnosis not present

## 2022-11-15 NOTE — ED Triage Notes (Signed)
Patient states she started having chest pain last night at work with SOB. Been having chest pain on and off for the past 3 years. Pt did say she had a asthma attack last night she used her inhaler and helped, and now still having chest pain in the center of her chest that feels like tightness/ pressure on her chest. Taking prednisone.

## 2022-11-15 NOTE — Progress Notes (Signed)
Because of ongoing chest pains with this I feel your condition warrants further evaluation and I recommend that you be seen in a face to face visit.   NOTE: There will be NO CHARGE for this eVisit   If you are having a true medical emergency please call 911.      For an urgent face to face visit, [facility] has eight urgent care centers for your convenience:   NEW!! [facility] [place] at [place] Get Driving Directions [phone] [address] [place], [place] [facility] [place] Get Driving Directions [id number] [place], [place] [address]   [place] [facility] [place] [facility]) Get Driving Directions [id number] [address], [place] [address]  [place] [facility] [place] ([place]) Get Driving Directions [id number] [address] [place],  [place]  [address]  [place] [facility] at [place] Get Driving Directions  [phone] [address] [place],  [place] [address]   [facility] Get Driving Directions [id number] [place], [place], [place] [address]   [facility] Get Driving Directions  [id number] [address] [place], [place] [address]   [facility] at [place] Get Driving Directions [id number] [address] [place], [place] [address]  Your MyChart E-visit questionnaire answers were reviewed by a board certified advanced clinical practitioner to complete your personal care plan based on your specific symptoms.  Thank you for using e-Visits.

## 2022-11-16 NOTE — ED Provider Notes (Signed)
[facility]    CSN: [id number] Arrival date & time: 11/15/22  1330      History   Chief Complaint Chief Complaint  Patient presents with   Chest Pain    HPI Lori [name] is a 22 y.o. female.   Patient presenting today with acute on chronic episodic chest tightness, shortness of breath.  States this has been an on-and-off problem for at least 2 years, followed by multiple specialist including asthma and allergy specialist, GI specialist and PCP as well as a cardiologist in the next few weeks.  She does have a history of asthma, GERD compliant with medication regimen for both.  She believes the stress of her job to be causing flareups of her symptoms.  Just completed a course of steroid and has been using her inhalers which did help some until she went back to work last night which caused an asthma attack and the chest pain to return.  She denies current chest pain, shortness of breath, wheezing, dizziness, palpitations, diaphoresis.    Past Medical History:  Diagnosis Date   Acid reflux    Asthma    Eczema    Headache     Patient Active Problem List   Diagnosis Date Noted   Nexplanon in place 02/04/2022   Current mild episode of major depressive disorder without prior episode ([place]) 11/03/2020   Epigastric pain 11/03/2020   Macromastia 09/06/2020   Chronic midline thoracic back pain 09/06/2020   Migraine with aura and without status migrainosus, not intractable 09/06/2020   Dysmenorrhea in adolescent 09/06/2020   Musculoskeletal chest pain 03/03/2020   Non-allergic rhinitis 12/30/2018   Mild persistent asthma, uncomplicated [id number]   Perennial allergic rhinitis 01/08/2018   Elevated hemoglobin A1c 12/18/2015   Pediatric obesity 12/18/2015   Acanthosis 12/18/2015   Vitamin D insufficiency 12/18/2015   Onychodystrophy 10/06/2013    Past Surgical History:  Procedure Laterality Date   TYMPANOSTOMY TUBE PLACEMENT     WISDOM TOOTH EXTRACTION      OB  History     Gravida  0   Para  0   Term  0   Preterm  0   AB  0   Living         SAB  0   IAB  0   Ectopic  0   Multiple      Live Births               Home Medications    Prior to Admission medications   Medication Sig Start Date End Date Taking? Authorizing Provider  acetaminophen (TYLENOL) 325 MG tablet Take 2 tablets (650 mg total) by mouth every 6 (six) hours as needed for mild pain, fever or headache. 10/04/16  Yes Scoville, Kennis Carina, NP  albuterol (VENTOLIN HFA) 108 (90 Base) MCG/ACT inhaler Inhale 2 puffs into the lungs every 6 (six) hours as needed for wheezing or shortness of breath. 11/05/22  Yes Valentina Shaggy, MD  fluticasone [facility]) 50 MCG/ACT nasal spray Place 2 sprays into both nostrils daily. 11/05/22  Yes Valentina Shaggy, MD  Fluticasone-Umeclidin-Vilant (TRELEGY ELLIPTA) 200-62.5-25 MCG/ACT AEPB Inhale 1 puff into the lungs daily. 11/05/22  Yes Valentina Shaggy, MD  levocetirizine (XYZAL) 5 MG tablet Take 1 tablet (5 mg total) by mouth every evening. 11/05/22 02/03/23 Yes Valentina Shaggy, MD  predniSONE (DELTASONE) 20 MG tablet Take 3 tabs ('30mg'$ ) twice daily for 3 days, then 2 tabs ('20mg'$ ) twice daily for 3 days, then  1 tab ('10mg'$ ) twice daily for 3 days, then STOP. 11/05/22  Yes Valentina Shaggy, MD  pseudoephedrine (SUDAFED) 60 MG tablet Take 1 tablet (60 mg total) by mouth every 8 (eight) hours as needed for congestion. 09/01/21  Yes Jaynee Eagles, PA-C  SYMBICORT 160-4.5 MCG/ACT inhaler Inhale 2 puffs into the lungs in the morning and at bedtime. 11/14/22  Yes Valentina Shaggy, MD  topiramate (TOPAMAX) 25 MG tablet Take 2 tablets (50 mg total) by mouth at bedtime. Start with 25 mg tablet at bedtime for 3 days then continue on 50 mg daily at bedtime. 10/20/20  Yes Abdelmoumen, Imane, MD  benzonatate (TESSALON) 100 MG capsule Take 1-2 capsules (100-200 mg total) by mouth 3 (three) times daily as needed for cough. 09/01/21    Jaynee Eagles, PA-C  clobetasol ointment (TEMOVATE) AB-123456789 % Apply 1 application topically 2 (two) times daily. 10/05/20   Valentina Shaggy, MD  naproxen (NAPROSYN) 375 MG tablet Take 1 tablet (375 mg total) by mouth 2 (two) times daily. 03/16/22   Kommor, [place], MD  ondansetron (ZOFRAN) 4 MG tablet Take 1 tablet (4 mg total) by mouth every 8 (eight) hours as needed for nausea or vomiting. 12/03/21   Mar Daring, PA-C  ondansetron (ZOFRAN-ODT) 4 MG disintegrating tablet Take 1 tablet (4 mg total) by mouth every 8 (eight) hours as needed for nausea or vomiting. 11/07/22   Volney American, PA-C  SUMAtriptan (IMITREX) 25 MG tablet Take by mouth. 07/27/20 10/20/20  [provider]    Family History Family History  Problem Relation Age of Onset   Hypertension Mother    Hyperlipidemia Father    Diabetes type II Father    Depression Father    Plantar fasciitis Father    Graves' disease Maternal Grandmother    Diabetes type II Maternal Grandmother    Hypertension Maternal Grandmother    Kidney Stones Maternal Grandmother    Stroke Maternal Grandfather    Hypertension Maternal Grandfather    Hyperlipidemia Maternal Grandfather    Heart attack Maternal Grandfather    Diabetes type II Maternal Grandfather    Diabetes Paternal Grandmother    Diabetes type II Paternal Grandmother    Hypertension Paternal Grandmother    Kidney failure Paternal Grandfather    Diabetic kidney disease Paternal Grandfather    Hypertension Paternal Grandfather    Diabetes type II Paternal Grandfather    Asthma Sister    Diabetes type I Maternal Great-grandmother    Obesity Sister 61   Vitamin D deficiency Sister 49    (Acanthosis) Sister 8    (Elevated hemoglobin A1c) Sister 45    Social History Social History   Tobacco Use   Smoking status: Never    Passive exposure: Yes   Smokeless tobacco: Never   Tobacco comments:    father smokes inside the home  Vaping Use   Vaping Use:  Never used  Substance Use Topics   Alcohol use: Yes    Comment: weekends   Drug use: Yes    Types: Marijuana    Comment: daily     Allergies   Other and Peanut-containing drug products   Review of Systems Review of Systems PER HPI  Physical Exam Triage Vital Signs ED Triage Vitals [11/15/22 1357]  Enc Vitals Group     BP 115/81     Pulse Rate 84     Resp 18     Temp 99.7 F (37.6 C)     Temp Source  Oral     SpO2 97 %     Weight      Height      Head Circumference      Peak Flow      Pain Score 8     Pain Loc      Pain Edu?      Excl. in [place]?    No data found.  Updated Vital Signs BP 115/81 (BP Location: Right Arm)   Pulse 84   Temp 99.7 F (37.6 C) (Oral)   Resp 18   LMP  (LMP Unknown) Comment: pt has the nexplanon implant  SpO2 97%   Visual Acuity Right Eye Distance:   Left Eye Distance:   Bilateral Distance:    Right Eye Near:   Left Eye Near:    Bilateral Near:     Physical Exam Vitals and nursing note reviewed.  Constitutional:      Appearance: Normal appearance. She is not ill-appearing.  HENT:     Head: Atraumatic.     Nose: Nose normal.     Mouth/Throat:     Mouth: Mucous membranes are moist.     Pharynx: Oropharynx is clear.  Eyes:     Extraocular Movements: Extraocular movements intact.     Conjunctiva/sclera: Conjunctivae normal.  Cardiovascular:     Rate and Rhythm: Normal rate and regular rhythm.     Heart sounds: Normal heart sounds.  Pulmonary:     Effort: Pulmonary effort is normal. No respiratory distress.     Breath sounds: Normal breath sounds. No wheezing or rales.  Chest:     Chest wall: No tenderness.  Musculoskeletal:        General: No tenderness. Normal range of motion.     Cervical back: Normal range of motion and neck supple.  Skin:    General: Skin is warm and dry.  Neurological:     Mental Status: She is alert and oriented to person, place, and time.  Psychiatric:        Mood and Affect: Mood normal.         Thought Content: Thought content normal.        Judgment: Judgment normal.      UC Treatments / Results  Labs (all labs ordered are listed, but only abnormal results are displayed) Labs Reviewed - No data to display  EKG   Radiology No results found.  Procedures Procedures (including critical care time)  Medications Ordered in UC Medications - No data to display  Initial Impression / Assessment and Plan / UC Course  I have reviewed the triage vital signs and the nursing notes.  Pertinent labs & imaging results that were available during my care of the patient were reviewed by me and considered in my medical decision making (see chart for details).     Vitals and exam benign and reassuring today with no abnormalities noted.  EKG showing normal sinus rhythm at 78 bpm without acute ST or T wave changes.  Issue has been ongoing for several years with episodic flareups.  Followed by multiple specialist.  Has cardiology appointment coming up and is on a maintenance asthma and GERD regimen.  Will extend work note as this seems to be what is exacerbating her symptoms and discussed close follow-up with PCP for ongoing discussions regarding restrictions or management of medications and supportive measures.  Final Clinical Impressions(s) / UC Diagnoses   Final diagnoses:  Chest wall pain   Discharge Instructions   None  ED Prescriptions   None    PDMP not reviewed this encounter.   Volney American, [place] 11/16/22 1427

## 2022-11-19 NOTE — Telephone Encounter (Signed)
I called patient and left a vm to call the office back to inform of change.

## 2023-02-04 ENCOUNTER — Ambulatory Visit (INDEPENDENT_AMBULATORY_CARE_PROVIDER_SITE_OTHER): Payer: Medicaid Other | Admitting: Allergy & Immunology

## 2023-02-04 ENCOUNTER — Encounter: Payer: Self-pay | Admitting: Allergy & Immunology

## 2023-02-04 ENCOUNTER — Other Ambulatory Visit: Payer: Self-pay

## 2023-02-04 VITALS — BP 110/70 | HR 71 | Temp 98.0°F | Resp 16 | Ht 60.0 in | Wt 202.3 lb

## 2023-02-04 DIAGNOSIS — J4530 Mild persistent asthma, uncomplicated: Secondary | ICD-10-CM

## 2023-02-04 DIAGNOSIS — J310 Chronic rhinitis: Secondary | ICD-10-CM | POA: Diagnosis not present

## 2023-02-04 DIAGNOSIS — T7800XD Anaphylactic reaction due to unspecified food, subsequent encounter: Secondary | ICD-10-CM

## 2023-02-04 NOTE — Progress Notes (Signed)
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 Mild persistent asthma with chest pain - Lung testing looked very stable.  - Agree with the plan for an echocardiogram approved.  - Daily controller medication(s): Trelegy one puff once daily  - Prior to physical activity: albuterol 2 puffs 10-15 minutes before physical activity. - Rescue medications: albuterol 4 puffs every 4-6 hours as needed - Asthma control goals:  * Full participation in all desired activities (may need albuterol before activity) * Albuterol use two time or less a week on average (not counting use with activity) * Cough interfering with sleep two time or less a month * Oral steroids no more than once a year * No hospitalizations  2. Non-allergic rhinitis - Continue with: Xyzal (levocetirizine) 5mg  tablet once daily and Flonase (fluticasone) one spray per nostril daily - You can use an extra dose of the antihistamine, if needed, for breakthrough symptoms.  - Consider nasal saline rinses 1-2 times daily to remove allergens from the nasal cavities as well as help with mucous clearance (this is especially helpful to do before the nasal sprays are given)  3. Peanut allergy - Continue with peanut avoidance as you are doing. - EpiPen training provided and EpiPen sent in.  - I would continue to avoid peanuts since you clinically reacted to these. - But tree nuts should be ok to have in your diet, as long as they are not cross contaminated with peanuts. - I think  that the peanuts are the more high risk food given the fact that you actually reacted to the peanuts.  4. Return in about 6 months (around 08/07/2023).    Subjective:   Lori [name] is a 22 y.o. female presenting today for follow up of  Chief Complaint  Patient presents with   Follow-up    No concerns    Lori [name] has a history of the following: Patient Active Problem List   Diagnosis Date Noted   Nexplanon in place 02/04/2022   Current mild episode of major depressive disorder without prior episode (HCC) 11/03/2020   Epigastric pain 11/03/2020   Macromastia 09/06/2020   Chronic midline thoracic back pain 09/06/2020   Migraine with aura and without status migrainosus, not intractable 09/06/2020   Dysmenorrhea in adolescent 09/06/2020   Musculoskeletal chest pain 03/03/2020   Non-allergic rhinitis 12/30/2018   Mild persistent asthma, uncomplicated 01/08/2018   Perennial allergic rhinitis 01/08/2018   Elevated hemoglobin A1c 12/18/2015   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  Lori [name] is a 22 y.o. female presenting for a follow up visit.  We last saw her in February 2024.  At that time, her lung testing looked excellent.  We started her on Trelegy 1 puff once daily to see if this would help with any of her chest pain.  We obtained some labs to look for resolution of her inflammatory markers.  For her rhinitis, we continue  with Xyzal as well as Flonase.  She continue to avoid peanuts.  EpiPen is up-to-date.  In the interim, she did see cardiology.  They felt that her pain was more likely musculoskeletal pain.  She did have an EKG which showed normal sinus rhythm. She has an echocardiogram scheduled for this week but Medicaid is not going to pay for it. They felt that everything was fairly normal.   Since last visit, she has done well.    Asthma/Respiratory Symptom History: Asthma is well controlled. She is not using the Trelegy EVERY day, but rather on a PRN basis. Chest pain is much better. She has been doing better self care and actually took a vacation recently. She is trying to slow down to help with her symptoms.  She is not coughing at a lot at night. She does not frequently need her rescue inhaler.   Allergic Rhinitis Symptom History: She has been doing well from an allergic rhinitis perspective. She remains on the montelukast and the cetirizine. This is working well to control her symptoms.  She has not been on antibiotics at all since the last visit. She denies any ear infections or pneumonia.  She has overall been doing very well with this regimen.  Food Allergy Symptom History: She continues to avoid peanuts.  Review of her testing shows that she had negative skin testing in January 2022 confirmed with negative blood testing at that same time.  This all stems from a reaction in December 2021 where she ended up having shortness of breath with chest tightness after eating a peanut.  She ate them a few times and this continued to happen.  Her mother has been very strict about keeping all peanuts and tree nuts out of her diet.  She would like to get tree nuts back into her diet.  Otherwise, there have been no changes to her past medical history, surgical history, family history, or social history.    Review of Systems  Constitutional: Negative.  Negative for chills, fever, malaise/fatigue and weight loss.  HENT: Negative.  Negative for congestion, ear discharge, ear pain and sore throat.   Eyes:  Negative for pain, discharge and redness.  Respiratory:  Negative for cough, sputum production, shortness of breath and wheezing.   Cardiovascular: Negative.  Negative for chest pain and palpitations.  Gastrointestinal:  Negative for abdominal pain, constipation, diarrhea, heartburn, nausea and vomiting.  Skin: Negative.   Negative for itching and rash.  Neurological:  Negative for dizziness and headaches.  Endo/Heme/Allergies:  Positive for environmental allergies. Does not bruise/bleed easily.       Objective:   Blood pressure 110/70, pulse 71, temperature 98 F (36.7 C), resp. rate 16, height 5' (1.524 m), weight 202 lb 4.8 oz (91.8 kg), SpO2 99 %. Body mass index is 39.51 kg/m.    Physical Exam Vitals reviewed.  Constitutional:      Appearance: She is well-developed.  HENT:     Head: Normocephalic and atraumatic.     Right Ear: Tympanic membrane, ear canal and external ear normal.     Left Ear: Tympanic membrane, ear canal and external ear normal.     Nose: No nasal deformity, septal deviation, mucosal edema or rhinorrhea.     Right Turbinates: Enlarged, swollen and pale.     Left Turbinates: Enlarged, swollen and pale.     Right Sinus: No maxillary sinus tenderness or frontal sinus tenderness.     Left Sinus: No maxillary sinus tenderness or frontal sinus  tenderness.     Comments: No nasal polyps.     Mouth/Throat:     Lips: Pink.     Mouth: Mucous membranes are moist. Mucous membranes are not pale and not dry.     Pharynx: Uvula midline.  Eyes:     General: Lids are normal. Allergic shiner present.        Right eye: No discharge.        Left eye: No discharge.     Conjunctiva/sclera: Conjunctivae normal.     Right eye: Right conjunctiva is not injected. No chemosis.    Left eye: Left conjunctiva is not injected. No chemosis.    Pupils: Pupils are equal, round, and reactive to light.  Cardiovascular:     Rate and Rhythm: Normal rate and regular rhythm.     Heart sounds: Normal heart sounds.  Pulmonary:     Effort: Pulmonary effort is normal. No tachypnea, accessory muscle usage or respiratory distress.     Breath sounds: Normal breath sounds. Transmitted upper airway sounds present. No wheezing, rhonchi or rales.     Comments: Moving air well in all lung fields. No increased work of  breathing noted.  Chest:     Chest wall: No tenderness.  Lymphadenopathy:     Cervical: No cervical adenopathy.  Skin:    General: Skin is warm.     Capillary Refill: Capillary refill takes less than 2 seconds.     Coloration: Skin is not ashen, jaundiced or pale.     Findings: No abrasion, erythema, petechiae or rash. Rash is not papular, urticarial or vesicular.  Neurological:     Mental Status: She is alert.  Psychiatric:        Behavior: Behavior is cooperative.      Diagnostic studies:    Spirometry: results normal (FEV1: 2.37/95%, FVC: 2.96/105%, FEV1/FVC: 80%).    Spirometry consistent with normal pattern.   Allergy Studies: none        Malachi Bonds, MD  [facility]

## 2023-02-04 NOTE — Patient Instructions (Addendum)
1. Mild persistent asthma with chest pain - Lung testing looked very stable.  - Agree with the plan for an echocardiogram approved.  - Daily controller medication(s): Trelegy one puff once daily  - Prior to physical activity: albuterol 2 puffs 10-15 minutes before physical activity. - Rescue medications: albuterol 4 puffs every 4-6 hours as needed - Asthma control goals:  * Full participation in all desired activities (may need albuterol before activity) * Albuterol use two time or less a week on average (not counting use with activity) * Cough interfering with sleep two time or less a month * Oral steroids no more than once a year * No hospitalizations  2. Non-allergic rhinitis - Continue with: Xyzal (levocetirizine) 5mg  tablet once daily and Flonase (fluticasone) one spray per nostril daily - You can use an extra dose of the antihistamine, if needed, for breakthrough symptoms.  - Consider nasal saline rinses 1-2 times daily to remove allergens from the nasal cavities as well as help with mucous clearance (this is especially helpful to do before the nasal sprays are given)  3. Peanut allergy - Continue with peanut avoidance as you are doing. - EpiPen training provided and EpiPen sent in.  - I would continue to avoid peanuts since you clinically reacted to these. - But tree nuts should be ok to have in your diet, as long as they are not cross contaminated with peanuts.     4. Return in about 6 months (around 08/07/2023).    Please inform us of any Emergency Department visits, hospitalizations, or changes in symptoms. Call us before going to the ED for breathing or allergy symptoms since we might be able to fit you in for a sick visit. Feel free to contact us anytime with any questions, problems, or concerns.  It was a pleasure to see you and your family again today!  Websites that have reliable patient information: 1. American Academy of Asthma, Allergy, and Immunology:  www.aaaai.org 2. Food Allergy Research and Education (FARE): foodallergy.org 3. Mothers of Asthmatics: http://www.asthmacommunitynetwork.org 4. American College of Allergy, Asthma, and Immunology: www.acaai.org   COVID-19 Vaccine Information can be found at: [email or web address] For questions related to vaccine distribution or appointments, please email [email or web address] or call [phone].     "Like" [place] on Facebook and Instagram for our latest updates!       Make sure you are registered to vote! If you have moved or changed any of your contact information, you will need to get this updated before voting!  In some cases, you MAY be able to register to vote online: [email or web address]

## 2023-03-28 ENCOUNTER — Encounter (INDEPENDENT_AMBULATORY_CARE_PROVIDER_SITE_OTHER): Payer: Self-pay

## 2023-04-29 ENCOUNTER — Encounter: Payer: Self-pay | Admitting: Emergency Medicine

## 2023-04-29 ENCOUNTER — Other Ambulatory Visit: Payer: Self-pay

## 2023-04-29 ENCOUNTER — Ambulatory Visit
Admission: EM | Admit: 2023-04-29 | Discharge: 2023-04-29 | Disposition: A | Discharge status: Home / Self Care | Payer: Medicaid Other | Attending: Family Medicine | Admitting: Family Medicine

## 2023-04-29 DIAGNOSIS — R112 Nausea with vomiting, unspecified: Secondary | ICD-10-CM

## 2023-04-29 DIAGNOSIS — R519 Headache, unspecified: Secondary | ICD-10-CM

## 2023-04-29 DIAGNOSIS — R197 Diarrhea, unspecified: Secondary | ICD-10-CM

## 2023-04-29 MED ORDER — ONDANSETRON 4 MG PO TBDP: 4.0000 mg | ORAL_TABLET | Freq: Three times a day (TID) | ORAL | 0 refills | Status: AC | PRN

## 2023-04-29 NOTE — ED Provider Notes (Signed)
[facility]    CSN: [id number] Arrival date & time: 04/29/23  1756      History   Chief Complaint Chief Complaint  Patient presents with   Nausea    HPI Lori [name] is a 22 y.o. female.   Patient presenting today with several day history of nausea, vomiting, diarrhea that she feels may be associated with consuming dairy.  She denies any past history of dairy intolerances or allergies but notes that she seems to mainly get symptoms after eating a dairy product since onset.  She is also had a headache since yesterday.  Denies congestion, cough, sore throat, abdominal pain, rashes, vaginal urinary symptoms.  Taking nausea medication with mild temporary benefit.  No new medications or supplements, no known sick contacts recently.    Past Medical History:  Diagnosis Date   Acid reflux    Asthma    Eczema    Headache     Patient Active Problem List   Diagnosis Date Noted   Nexplanon in place 02/04/2022   Current mild episode of major depressive disorder without prior episode (HCC) 11/03/2020   Epigastric pain 11/03/2020   Macromastia 09/06/2020   Chronic midline thoracic back pain 09/06/2020   Migraine with aura and without status migrainosus, not intractable 09/06/2020   Dysmenorrhea in adolescent 09/06/2020   Musculoskeletal chest pain 03/03/2020   Non-allergic rhinitis 12/30/2018   Mild persistent asthma, uncomplicated 01/08/2018   Perennial allergic rhinitis 01/08/2018   Elevated hemoglobin A1c 12/18/2015   Pediatric obesity 12/18/2015   Acanthosis 12/18/2015   Vitamin D insufficiency 12/18/2015   Onychodystrophy 10/06/2013    Past Surgical History:  Procedure Laterality Date   TYMPANOSTOMY TUBE PLACEMENT     WISDOM TOOTH EXTRACTION      OB History     Gravida  0   Para  0   Term  0   Preterm  0   AB  0   Living         SAB  0   IAB  0   Ectopic  0   Multiple      Live Births               Home Medications    Prior  to Admission medications   Medication Sig Start Date End Date Taking? Authorizing Provider  ondansetron (ZOFRAN-ODT) 4 MG disintegrating tablet Take 1 tablet (4 mg total) by mouth every 8 (eight) hours as needed for nausea or vomiting. 04/29/23  Yes Particia Nearing, PA-C  acetaminophen (TYLENOL) 325 MG tablet Take 2 tablets (650 mg total) by mouth every 6 (six) hours as needed for mild pain, fever or headache. 10/04/16   [place], Nadara Mustard, NP  albuterol (VENTOLIN HFA) 108 (90 Base) MCG/ACT inhaler Inhale 2 puffs into the lungs every 6 (six) hours as needed for wheezing or shortness of breath. 11/05/22   Alfonse Spruce, MD  clobetasol ointment (TEMOVATE) 0.05 % Apply 1 application topically 2 (two) times daily. 10/05/20   Alfonse Spruce, MD  fluticasone [facility]) 50 MCG/ACT nasal spray Place 2 sprays into both nostrils daily. 11/05/22   Alfonse Spruce, MD  Fluticasone-Umeclidin-Vilant (TRELEGY ELLIPTA) 200-62.5-25 MCG/ACT AEPB Inhale 1 puff into the lungs daily. 11/05/22   Alfonse Spruce, MD  levocetirizine (XYZAL) 5 MG tablet Take 1 tablet (5 mg total) by mouth every evening. 11/05/22 02/03/23  Alfonse Spruce, MD  naproxen (NAPROSYN) 375 MG tablet Take 1 tablet (375 mg total) by mouth  2 (two) times daily. 03/16/22   Kommor, [place], MD  ondansetron (ZOFRAN) 4 MG tablet Take 1 tablet (4 mg total) by mouth every 8 (eight) hours as needed for nausea or vomiting. 12/03/21   Margaretann Loveless, PA-C  ondansetron (ZOFRAN-ODT) 4 MG disintegrating tablet Take 1 tablet (4 mg total) by mouth every 8 (eight) hours as needed for nausea or vomiting. 11/07/22   Particia Nearing, PA-C  pseudoephedrine (SUDAFED) 60 MG tablet Take 1 tablet (60 mg total) by mouth every 8 (eight) hours as needed for congestion. 09/01/21   Wallis Bamberg, PA-C  SYMBICORT 160-4.5 MCG/ACT inhaler Inhale 2 puffs into the lungs in the morning and at bedtime. 11/14/22   Alfonse Spruce, MD  topiramate  (TOPAMAX) 25 MG tablet Take 2 tablets (50 mg total) by mouth at bedtime. Start with 25 mg tablet at bedtime for 3 days then continue on 50 mg daily at bedtime. 10/20/20   Abdelmoumen, Jenna Luo, MD  SUMAtriptan (IMITREX) 25 MG tablet Take by mouth. 07/27/20 10/20/20  [provider]    Family History Family History  Problem Relation Age of Onset   Hypertension Mother    Hyperlipidemia Father    Diabetes type II Father    Depression Father    Plantar fasciitis Father    Graves' disease Maternal Grandmother    Diabetes type II Maternal Grandmother    Hypertension Maternal Grandmother    Kidney Stones Maternal Grandmother    Stroke Maternal Grandfather    Hypertension Maternal Grandfather    Hyperlipidemia Maternal Grandfather    Heart attack Maternal Grandfather    Diabetes type II Maternal Grandfather    Diabetes Paternal Grandmother    Diabetes type II Paternal Grandmother    Hypertension Paternal Grandmother    Kidney failure Paternal Grandfather    Diabetic kidney disease Paternal Grandfather    Hypertension Paternal Grandfather    Diabetes type II Paternal Grandfather    Asthma Sister    Diabetes type I Maternal Great-grandmother    Obesity Sister 75   Vitamin D deficiency Sister 80    (Acanthosis) Sister 45    (Elevated hemoglobin A1c) Sister 7    Social History Social History   Tobacco Use   Smoking status: Never    Passive exposure: Yes   Smokeless tobacco: Never   Tobacco comments:    father smokes inside the home  Vaping Use   Vaping status: Every Day  Substance Use Topics   Alcohol use: Yes    Comment: weekends   Drug use: Yes    Types: Marijuana    Comment: occ     Allergies   Other and Peanut-containing drug products   Review of Systems Review of Systems Per HPI  Physical Exam Triage Vital Signs ED Triage Vitals [04/29/23 1803]  Encounter Vitals Group     BP 124/78     Systolic BP Percentile      Diastolic BP Percentile      Pulse  Rate 85     Resp 20     Temp 98.2 F (36.8 C)     Temp Source Oral     SpO2 100 %     Weight      Height      Head Circumference      Peak Flow      Pain Score 8     Pain Loc      Pain Education      Exclude from Growth Chart  No data found.  Updated Vital Signs BP 124/78 (BP Location: Right Arm)   Pulse 85   Temp 98.2 F (36.8 C) (Oral)   Resp 20   SpO2 100%   Visual Acuity Right Eye Distance:   Left Eye Distance:   Bilateral Distance:    Right Eye Near:   Left Eye Near:    Bilateral Near:     Physical Exam Vitals and nursing note reviewed.  Constitutional:      Appearance: Normal appearance. She is not ill-appearing.  HENT:     Head: Atraumatic.     Mouth/Throat:     Mouth: Mucous membranes are moist.     Pharynx: Oropharynx is clear. No posterior oropharyngeal erythema.  Eyes:     Extraocular Movements: Extraocular movements intact.     Conjunctiva/sclera: Conjunctivae normal.  Cardiovascular:     Rate and Rhythm: Normal rate and regular rhythm.     Heart sounds: Normal heart sounds.  Pulmonary:     Effort: Pulmonary effort is normal.     Breath sounds: Normal breath sounds.  Abdominal:     General: Bowel sounds are normal. There is no distension.     Palpations: Abdomen is soft.     Tenderness: There is no abdominal tenderness. There is no guarding.  Musculoskeletal:        General: Normal range of motion.     Cervical back: Normal range of motion and neck supple.  Skin:    General: Skin is warm and dry.  Neurological:     Mental Status: She is alert and oriented to person, place, and time.  Psychiatric:        Mood and Affect: Mood normal.        Thought Content: Thought content normal.        Judgment: Judgment normal.    UC Treatments / Results  Labs (all labs ordered are listed, but only abnormal results are displayed) Labs Reviewed - No data to display  EKG  Radiology No results found.  Procedures Procedures (including  critical care time)  Medications Ordered in UC Medications - No data to display  Initial Impression / Assessment and Plan / UC Course  I have reviewed the triage vital signs and the nursing notes.  Pertinent labs & imaging results that were available during my care of the patient were reviewed by me and considered in my medical decision making (see chart for details).     Vitals and exam benign and reassuring today, no red flag findings.  Suspect viral illness, however did discuss to trial an elimination diet excluding dairy for the next 2 to 4 weeks and monitor symptoms.  Follow-up with primary care for recheck.  Zofran sent for as needed use, brat diet and fluids reviewed.  Work note given.  Final Clinical Impressions(s) / UC Diagnoses   Final diagnoses:  Nausea vomiting and diarrhea  Acute nonintractable headache, unspecified headache type     Discharge Instructions      Because of your suspicion for a dairy allergy causing your symptoms, tried elimination diet of no dairy for the next 2 to 4 weeks and see how you feel.  Take Zofran as needed additionally for nausea and vomiting.  Stay well hydrated and follow-up with your primary care provider for a recheck.    ED Prescriptions     Medication Sig Dispense Auth. Provider   ondansetron (ZOFRAN-ODT) 4 MG disintegrating tablet Take 1 tablet (4 mg total) by mouth every 8 (  eight) hours as needed for nausea or vomiting. 20 tablet Particia Nearing, [place]      PDMP not reviewed this encounter.   Particia Nearing, [place] 04/29/23 Windell Moment

## 2023-04-29 NOTE — ED Triage Notes (Signed)
Pt reports nausea, emesis since Sunday and reports headache since earlier this am. Pt is inquiring about possible diary allergy and starts symptoms seem to arise after eating diary products.

## 2023-04-29 NOTE — Discharge Instructions (Signed)
Because of your suspicion for a dairy allergy causing your symptoms, tried elimination diet of no dairy for the next 2 to 4 weeks and see how you feel.  Take Zofran as needed additionally for nausea and vomiting.  Stay well hydrated and follow-up with your primary care provider for a recheck.

## 2023-07-03 ENCOUNTER — Other Ambulatory Visit (HOSPITAL_COMMUNITY)
Admission: RE | Admit: 2023-07-03 | Discharge: 2023-07-03 | Disposition: A | Discharge status: Home / Self Care | Payer: Medicaid Other | Source: Ambulatory Visit | Attending: Obstetrics and Gynecology | Admitting: Obstetrics and Gynecology

## 2023-07-03 ENCOUNTER — Ambulatory Visit: Payer: Medicaid Other

## 2023-07-03 VITALS — BP 126/86 | HR 69

## 2023-07-03 DIAGNOSIS — N898 Other specified noninflammatory disorders of vagina: Secondary | ICD-10-CM

## 2023-07-03 DIAGNOSIS — Z113 Encounter for screening for infections with a predominantly sexual mode of transmission: Secondary | ICD-10-CM

## 2023-07-03 NOTE — Progress Notes (Signed)
..  SUBJECTIVE:  22 y.o. female complains of  vaginal discharge for a few day(s) and reports having a new partner. Denies abnormal vaginal bleeding or significant pelvic pain or fever. No UTI symptoms. Denies history of known exposure to STD.  No LMP recorded. Patient has had an implant.  OBJECTIVE:  She appears well, afebrile. Urine dipstick: not done.  ASSESSMENT:  Vaginal Discharge  New Partner   PLAN:  GC, chlamydia, trichomonas, BVAG, CVAG probe sent to lab. Treatment: To be determined once lab results are received ROV prn if symptoms persist or worsen.

## 2023-07-04 ENCOUNTER — Other Ambulatory Visit: Payer: Self-pay | Admitting: Obstetrics and Gynecology

## 2023-07-04 DIAGNOSIS — B3731 Acute candidiasis of vulva and vagina: Secondary | ICD-10-CM

## 2023-07-04 DIAGNOSIS — B9689 Other specified bacterial agents as the cause of diseases classified elsewhere: Secondary | ICD-10-CM

## 2023-07-04 LAB — CERVICOVAGINAL ANCILLARY ONLY
Bacterial Vaginitis (gardnerella): POSITIVE — AB
Candida Glabrata: NEGATIVE
Candida Vaginitis: POSITIVE — AB
Chlamydia: NEGATIVE
Comment: NEGATIVE
Comment: NEGATIVE
Comment: NEGATIVE
Comment: NEGATIVE
Comment: NEGATIVE
Comment: NORMAL
Neisseria Gonorrhea: NEGATIVE
Trichomonas: POSITIVE — AB

## 2023-07-04 LAB — RPR: RPR Ser Ql: NONREACTIVE

## 2023-07-04 LAB — HIV ANTIBODY (ROUTINE TESTING W REFLEX): HIV Screen 4th Generation wRfx: NONREACTIVE

## 2023-07-04 LAB — HEPATITIS B SURFACE ANTIGEN: Hepatitis B Surface Ag: NEGATIVE

## 2023-07-04 LAB — HEPATITIS C ANTIBODY: Hep C Virus Ab: NONREACTIVE

## 2023-07-04 MED ORDER — METRONIDAZOLE 500 MG PO TABS: 500.0000 mg | ORAL_TABLET | Freq: Two times a day (BID) | ORAL | 0 refills | Status: AC

## 2023-07-04 MED ORDER — FLUCONAZOLE 150 MG PO TABS: 150.0000 mg | ORAL_TABLET | Freq: Once | ORAL | 0 refills | Status: AC

## 2023-07-08 ENCOUNTER — Other Ambulatory Visit: Payer: Self-pay

## 2023-07-08 ENCOUNTER — Encounter (HOSPITAL_COMMUNITY): Payer: Self-pay

## 2023-07-08 ENCOUNTER — Emergency Department (HOSPITAL_COMMUNITY): Payer: Medicaid Other

## 2023-07-08 ENCOUNTER — Emergency Department (HOSPITAL_COMMUNITY)
Admission: EM | Admit: 2023-07-08 | Discharge: 2023-07-08 | Disposition: A | Discharge status: Home / Self Care | Payer: Medicaid Other | Attending: Emergency Medicine | Admitting: Emergency Medicine

## 2023-07-08 DIAGNOSIS — R0789 Other chest pain: Secondary | ICD-10-CM | POA: Insufficient documentation

## 2023-07-08 DIAGNOSIS — D649 Anemia, unspecified: Secondary | ICD-10-CM | POA: Diagnosis not present

## 2023-07-08 DIAGNOSIS — F419 Anxiety disorder, unspecified: Secondary | ICD-10-CM | POA: Insufficient documentation

## 2023-07-08 DIAGNOSIS — Z91010 Allergy to peanuts: Secondary | ICD-10-CM | POA: Diagnosis not present

## 2023-07-08 LAB — CBC
HCT: 36.1 % (ref 36.0–46.0)
Hemoglobin: 11.2 g/dL — ABNORMAL LOW (ref 12.0–15.0)
MCH: 27.1 pg (ref 26.0–34.0)
MCHC: 31 g/dL (ref 30.0–36.0)
MCV: 87.4 fL (ref 80.0–100.0)
Platelets: 246 10*3/uL (ref 150–400)
RBC: 4.13 MIL/uL (ref 3.87–5.11)
RDW: 15.3 % (ref 11.5–15.5)
WBC: 6 10*3/uL (ref 4.0–10.5)
nRBC: 0 % (ref 0.0–0.2)

## 2023-07-08 LAB — BASIC METABOLIC PANEL
Anion gap: 7 (ref 5–15)
BUN: 10 mg/dL (ref 6–20)
CO2: 24 mmol/L (ref 22–32)
Calcium: 8.5 mg/dL — ABNORMAL LOW (ref 8.9–10.3)
Chloride: 105 mmol/L (ref 98–111)
Creatinine, Ser: 0.76 mg/dL (ref 0.44–1.00)
GFR, Estimated: >60 mL/min (ref >60)
Glucose, Bld: 90 mg/dL (ref 70–99)
Potassium: 3.8 mmol/L (ref 3.5–5.1)
Sodium: 136 mmol/L (ref 135–145)

## 2023-07-08 LAB — TROPONIN I (HIGH SENSITIVITY): Troponin I (High Sensitivity): <2 ng/L (ref <18)

## 2023-07-08 LAB — PREGNANCY, URINE: Preg Test, Ur: NEGATIVE

## 2023-07-08 MED ORDER — IBUPROFEN 600 MG PO TABS: 600.0000 mg | ORAL_TABLET | Freq: Four times a day (QID) | ORAL | 0 refills | Status: AC | PRN

## 2023-07-08 MED ORDER — HYDROXYZINE HCL 25 MG PO TABS: 25.0000 mg | ORAL_TABLET | Freq: Four times a day (QID) | ORAL | 0 refills | Status: AC

## 2023-07-08 NOTE — ED Triage Notes (Signed)
Pt anxious in triage Pt stated that she has not been sleeping well lately  Pt crying, stated "I worked and 18 hours shift yesterday"  Pt denies SI/HI

## 2023-07-08 NOTE — Discharge Instructions (Addendum)
You were seen in the emergency room today for evaluation of your chest pain and anxiety.  Your lab work does show you have some mild anemia which I would follow-up with your primary care provider about.  If you do not have one, have included information for one of the discharge paperwork.  Ultimately, would like for you to follow-up with them about your anxiety as well.  For your chest wall pain, is likely your muscles.  For this, prescribed you some ibuprofen 6 or milligrams take every 6 hours as needed for pain.  You also add on 1000mg  of Tylenol to this as well.  Please make sure to invest in a good supporting bras as I think will help with a lot of your chest pain.  I have also sent you in a prescription called hydroxyzine which can take as needed for anxiety.  If you have any concerns, new or worsening symptoms, please return to your nearest emergency department for reevaluation.  Contact a doctor if: You have a fever. Your chest pain gets worse. You have new symptoms. Get help right away if: You feel sick to your stomach (nauseous) or you throw up (vomit). You feel sweaty or light-headed. You have a cough with mucus from your lungs (sputum) or you cough up blood. You are short of breath. These symptoms may be an emergency. Do not wait to see if the symptoms will go away. Get medical help right away. Call your local emergency services (911 in the [place] Do not drive yourself to the hospital.  [facility] Primary Care Doctor List    Syliva Overman, MD. Specialty: [facility] Contact information: [address]  [place] [address]  [phone]   Lilyan Punt, MD. Specialty: [facility] Contact information: [address]  [place] [address]  [phone]   Avon Gully, MD Specialty: Internal Medicine Contact information: [address] [place] [address]  [phone]   Catalina Pizza, MD. Specialty: Internal Medicine Contact information:  [address]  [place] [address]  [phone]    [facility] (Dr. Selena Batten) Specialty: Family Medicine Contact information: [address]  [place] [address]  [phone]   John Giovanni, MD. Specialty: [facility] Contact information: [address]  [address]  [place] [address]  [phone]   Carylon Perches, MD. Specialty: Internal Medicine Contact information: [address]  [address]  [place] [address]  [phone]    [facility]  [address] [place], [place] [address] [phone]  Services [facility] offers a variety of basic health services.  Services include but are not limited to: Blood pressure checks  Heart rate checks  Blood sugar checks  Urine analysis  Rapid strep tests  Pregnancy tests.  Health education and referrals  People needing more complex services will be directed to a physician online. Using these virtual visits, doctors can evaluate and prescribe medicine and treatments. There will be no medication on-site, though [facility] will help patients fill their prescriptions at little to no cost.   For More information please go to: [email or web address]

## 2023-07-08 NOTE — ED Provider Notes (Signed)
Lori [name] AT [facility] Provider Note   CSN: [id number] Arrival date & time: 07/08/23  1320     History Chief Complaint  Patient presents with   Panic Attack    Lori [name] is a 22 y.o. female reportedly otherwise healthy presents emerged part today for evaluation of feeling overwhelmed at work today.  Patient reports that she is been working double shifts lately and has been feeling overworked and tired.  She reports that she wears a closing shift 1 day in the next day worth opening shift.  She feels like she has been overworked because her job is short staffed.  She reports that it was during a lunch rush today at [employer] when a lot of her coworkers were not there and she had a large lunch rush and was feeling overwhelmed.  She reports that she felt short of breath and was starting to panic because of feeling overwhelmed.  She also reports chest pain worse with movement and palpation off-and-on for the past few years as well.  She denies any suicidal or homicidal ideations.  She reports that she is feeling better now that she is not at work and is in the hospital.  She denies any leg swelling, OCP use, history of hypercoagulable disorder, history of DVT/PE, no recent long travel, no history of cancer, no hemoptysis.  HPI     Home Medications Prior to Admission medications   Medication Sig Start Date End Date Taking? Authorizing Provider  acetaminophen (TYLENOL) 325 MG tablet Take 2 tablets (650 mg total) by mouth every 6 (six) hours as needed for mild pain, fever or headache. 10/04/16   Scoville, Nadara Mustard, NP  albuterol (VENTOLIN HFA) 108 (90 Base) MCG/ACT inhaler Inhale 2 puffs into the lungs every 6 (six) hours as needed for wheezing or shortness of breath. 11/05/22   Alfonse Spruce, MD  clobetasol ointment (TEMOVATE) 0.05 % Apply 1 application topically 2 (two) times daily. 10/05/20   Alfonse Spruce, MD  fluticasone [facility]) 50  MCG/ACT nasal spray Place 2 sprays into both nostrils daily. 11/05/22   Alfonse Spruce, MD  Fluticasone-Umeclidin-Vilant (TRELEGY ELLIPTA) 200-62.5-25 MCG/ACT AEPB Inhale 1 puff into the lungs daily. 11/05/22   Alfonse Spruce, MD  levocetirizine (XYZAL) 5 MG tablet Take 1 tablet (5 mg total) by mouth every evening. 11/05/22 02/03/23  Alfonse Spruce, MD  metroNIDAZOLE (FLAGYL) 500 MG tablet Take 1 tablet (500 mg total) by mouth 2 (two) times daily. 07/04/23   Hermina Staggers, MD  naproxen (NAPROSYN) 375 MG tablet Take 1 tablet (375 mg total) by mouth 2 (two) times daily. 03/16/22   Kommor, Madison, MD  ondansetron (ZOFRAN) 4 MG tablet Take 1 tablet (4 mg total) by mouth every 8 (eight) hours as needed for nausea or vomiting. 12/03/21   Margaretann Loveless, PA-C  ondansetron (ZOFRAN-ODT) 4 MG disintegrating tablet Take 1 tablet (4 mg total) by mouth every 8 (eight) hours as needed for nausea or vomiting. 11/07/22   Particia Nearing, PA-C  ondansetron (ZOFRAN-ODT) 4 MG disintegrating tablet Take 1 tablet (4 mg total) by mouth every 8 (eight) hours as needed for nausea or vomiting. 04/29/23   Particia Nearing, PA-C  pseudoephedrine (SUDAFED) 60 MG tablet Take 1 tablet (60 mg total) by mouth every 8 (eight) hours as needed for congestion. 09/01/21   Wallis Bamberg, PA-C  SYMBICORT 160-4.5 MCG/ACT inhaler Inhale 2 puffs into the lungs in the morning and at bedtime.  11/14/22   Alfonse Spruce, MD  topiramate (TOPAMAX) 25 MG tablet Take 2 tablets (50 mg total) by mouth at bedtime. Start with 25 mg tablet at bedtime for 3 days then continue on 50 mg daily at bedtime. 10/20/20   Abdelmoumen, Jenna Luo, MD  SUMAtriptan (IMITREX) 25 MG tablet Take by mouth. 07/27/20 10/20/20  [provider]      Allergies    Other and Peanut-containing drug products    Review of Systems   Review of Systems  Constitutional:  Negative for chills and fever.  HENT:  Negative for congestion and  rhinorrhea.   Respiratory:  Positive for shortness of breath. Negative for cough and wheezing.   Cardiovascular:  Positive for chest pain.  Gastrointestinal:  Negative for abdominal pain and nausea.  Neurological:  Negative for syncope.    Physical Exam Updated Vital Signs BP 113/68   Pulse 83   Temp 98.1 F (36.7 C) (Oral)   Resp 16   Ht 5' (1.524 m)   Wt 81.6 kg   SpO2 98%   BMI 35.15 kg/m  Physical Exam Vitals and nursing note reviewed.  Constitutional:      General: She is not in acute distress.    Appearance: She is not toxic-appearing.  HENT:     Mouth/Throat:     Mouth: Mucous membranes are moist.  Eyes:     General: No scleral icterus. Cardiovascular:     Rate and Rhythm: Normal rate.     Pulses: Normal pulses.  Pulmonary:     Effort: Pulmonary effort is normal. No respiratory distress.     Breath sounds: Normal breath sounds.     Comments: Diffuse anterior chest wall tenderness to palpation. No overlying skin changes noted.  Chest:     Chest wall: Tenderness present.  Musculoskeletal:     Cervical back: Normal range of motion.     Right lower leg: No edema.     Left lower leg: No edema.  Skin:    General: Skin is warm and dry.  Neurological:     General: No focal deficit present.     Mental Status: She is alert. Mental status is at baseline.  Psychiatric:        Thought Content: Thought content does not include homicidal or suicidal ideation. Thought content does not include homicidal or suicidal plan.     ED Results / Procedures / Treatments   Labs (all labs ordered are listed, but only abnormal results are displayed) Labs Reviewed  CBC - Abnormal; Notable for the following components:      Result Value   Hemoglobin 11.2 (*)    All other components within normal limits  BASIC METABOLIC PANEL - Abnormal; Notable for the following components:   Calcium 8.5 (*)    All other components within normal limits  PREGNANCY, URINE  TROPONIN I (HIGH  SENSITIVITY)    EKG EKG Interpretation Date/Time:  Tuesday July 08 2023 16:36:48 EDT Ventricular Rate:  80 PR Interval:  118 QRS Duration:  87 QT Interval:  404 QTC Calculation: 466 R Axis:   21  Text Interpretation: Sinus arrhythmia Borderline short PR interval no acute ST/T changes similar to Feb 2024 Confirmed by Pricilla Loveless [phone]) on 07/08/2023 5:49:25 PM  Radiology DG Chest 2 View  Result Date: 07/08/2023 CLINICAL DATA:  Chest pain EXAM: CHEST - 2 VIEW COMPARISON:  04/03/2022 plain film and CT FINDINGS: Midline trachea. Normal heart size and mediastinal contours. No pleural effusion or pneumothorax.  Clear lungs. IMPRESSION: No active cardiopulmonary disease. Electronically Signed   By: Jeronimo Greaves M.D.   On: 07/08/2023 17:57    Procedures Procedures   Medications Ordered in ED Medications - No data to display  ED Course/ Medical Decision Making/ A&P                               Medical Decision Making Amount and/or Complexity of Data Reviewed Labs: ordered. Radiology: ordered.  Risk Prescription drug management.   22 y.o. female presents to the ER for evaluation of anxiety/chest pain. Differential diagnosis includes but is not limited to ACS, pericarditis, myocarditis, aortic dissection, PE, pneumothorax, esophageal spasm or rupture, chronic angina, pneumonia, bronchitis, GERD, reflux/PUD, biliary disease, pancreatitis, costochondritis, anxiety. Vital signs unremarkable. Physical exam as noted above.   The patient is PERC negative and low Well's criteria. I have a low suspicion for any PE. Not consistent with aneurysm or dissection. She is complaining of some reproducible chest pain upon palpation and movement. Likely costochondritis. Has been seen multiple times in the past for chest wall pain. Will order labs. The patient reports that she has been feeling overwhelmed and anxious at work.  I independently reviewed and interpreted the patient's labs.  Pregnancy is negative. CBC with mild anemia at 11.2, which is new. Troponin <2, no need for second troponin. BMP shows mild hypocalcemia at 8.5, no other electrolyte abnormality. HCG negative.   EKG reviewed and interpreted by my attending and read as Sinus arrhythmia Borderline short PR interval no acute ST/T changes similar to Feb 2024.  CXR per radiologist's report shows no acute cardiopulmonary process.    Cardiac workup is reassuring.  Low station for any ACS given reassuring troponins and EKG.  She is not having any cough or cold symptoms, less likely pneumonia or bronchitis given reassuring chest x-ray as well.  Will have her follow-up with her PCP.  Recommended taking ibuprofen Tylenol for chest wall pain.  I have prescribed her some hydroxyzine to take for her anxiety.  She is not having any suicidal or homicidal ideations.  Will give her a few days off of work to rest and recuperate.  We discussed the results of the labs/imaging. The plan is follow up with PCP. We discussed strict return precautions and red flag symptoms. The patient verbalized their understanding and agrees to the plan. The patient is stable and being discharged home in good condition.  Portions of this report may have been transcribed using voice recognition software. Every effort was made to ensure accuracy; however, inadvertent computerized transcription errors may be present.   Final Clinical Impression(s) / ED Diagnoses Final diagnoses:  Chest wall pain  Anxiety    Rx / DC Orders ED Discharge Orders          Ordered    ibuprofen (ADVIL) 600 MG tablet  Every 6 hours PRN        07/08/23 1841    hydrOXYzine (ATARAX) 25 MG tablet  Every 6 hours        07/08/23 1841              Achille Rich, PA-C 07/10/23 0002    Bethann Berkshire, MD 07/10/23 1652
